# Patient Record
Sex: Female | Born: 1940
Health system: Southern US, Community
[De-identification: ages and names within clinical notes are randomized; demographics above are authoritative.]

## PROBLEM LIST (undated history)

## (undated) DIAGNOSIS — M1990 Unspecified osteoarthritis, unspecified site: Secondary | ICD-10-CM

## (undated) DIAGNOSIS — R011 Cardiac murmur, unspecified: Secondary | ICD-10-CM

## (undated) DIAGNOSIS — D649 Anemia, unspecified: Secondary | ICD-10-CM

## (undated) DIAGNOSIS — F419 Anxiety disorder, unspecified: Secondary | ICD-10-CM

## (undated) DIAGNOSIS — E785 Hyperlipidemia, unspecified: Secondary | ICD-10-CM

## (undated) DIAGNOSIS — K219 Gastro-esophageal reflux disease without esophagitis: Secondary | ICD-10-CM

## (undated) DIAGNOSIS — H269 Unspecified cataract: Secondary | ICD-10-CM

## (undated) DIAGNOSIS — I10 Essential (primary) hypertension: Secondary | ICD-10-CM

## (undated) DIAGNOSIS — Z8619 Personal history of other infectious and parasitic diseases: Secondary | ICD-10-CM

## (undated) DIAGNOSIS — M25512 Pain in left shoulder: Secondary | ICD-10-CM

## (undated) DIAGNOSIS — B029 Zoster without complications: Secondary | ICD-10-CM

## (undated) DIAGNOSIS — E559 Vitamin D deficiency, unspecified: Secondary | ICD-10-CM

## (undated) HISTORY — DX: Hyperlipidemia, unspecified: E78.5

## (undated) HISTORY — PX: CATARACT EXTRACTION: SUR2

## (undated) HISTORY — DX: Zoster without complications: B02.9

## (undated) HISTORY — DX: Unspecified osteoarthritis, unspecified site: M19.90

## (undated) HISTORY — DX: Cardiac murmur, unspecified: R01.1

## (undated) HISTORY — PX: APPENDECTOMY: SHX54

## (undated) HISTORY — DX: Unspecified cataract: H26.9

## (undated) HISTORY — DX: Gastro-esophageal reflux disease without esophagitis: K21.9

## (undated) HISTORY — DX: Anxiety disorder, unspecified: F41.9

## (undated) HISTORY — DX: Anemia, unspecified: D64.9

## (undated) HISTORY — PX: UPPER GASTROINTESTINAL ENDOSCOPY: SHX188

## (undated) HISTORY — PX: TONSILLECTOMY AND ADENOIDECTOMY: SUR1326

## (undated) HISTORY — DX: Vitamin D deficiency, unspecified: E55.9

## (undated) HISTORY — PX: COLONOSCOPY: SHX174

## (undated) HISTORY — DX: Essential (primary) hypertension: I10

## (undated) HISTORY — DX: Personal history of other infectious and parasitic diseases: Z86.19

## (undated) HISTORY — PX: FOOT SURGERY: SHX648

## (undated) HISTORY — DX: Pain in left shoulder: M25.512

---

## 1970-11-24 HISTORY — PX: ABDOMINAL HYSTERECTOMY: SHX81

## 2005-04-28 ENCOUNTER — Encounter: Admission: RE | Admit: 2005-04-28 | Discharge: 2005-04-28 | Payer: Self-pay | Admitting: Orthopedic Surgery

## 2005-04-29 ENCOUNTER — Ambulatory Visit (HOSPITAL_BASED_OUTPATIENT_CLINIC_OR_DEPARTMENT_OTHER): Admission: RE | Admit: 2005-04-29 | Discharge: 2005-04-29 | Payer: Self-pay | Admitting: Orthopedic Surgery

## 2005-04-29 ENCOUNTER — Ambulatory Visit (HOSPITAL_COMMUNITY): Admission: RE | Admit: 2005-04-29 | Discharge: 2005-04-29 | Payer: Self-pay | Admitting: Orthopedic Surgery

## 2005-12-08 ENCOUNTER — Other Ambulatory Visit: Admission: RE | Admit: 2005-12-08 | Discharge: 2005-12-08 | Payer: Self-pay | Admitting: *Deleted

## 2007-01-29 ENCOUNTER — Ambulatory Visit (HOSPITAL_COMMUNITY): Admission: RE | Admit: 2007-01-29 | Discharge: 2007-01-29 | Payer: Self-pay | Admitting: Internal Medicine

## 2007-01-29 ENCOUNTER — Ambulatory Visit: Payer: Self-pay | Admitting: Internal Medicine

## 2009-12-25 LAB — HM MAMMOGRAPHY

## 2009-12-25 LAB — HM DEXA SCAN

## 2011-07-07 ENCOUNTER — Encounter: Payer: Self-pay | Admitting: Gastroenterology

## 2011-08-28 ENCOUNTER — Other Ambulatory Visit: Payer: Self-pay | Admitting: Gastroenterology

## 2011-09-09 ENCOUNTER — Ambulatory Visit (INDEPENDENT_AMBULATORY_CARE_PROVIDER_SITE_OTHER): Payer: Medicare Other | Admitting: Family Medicine

## 2011-09-09 ENCOUNTER — Encounter: Payer: Self-pay | Admitting: Family Medicine

## 2011-09-09 DIAGNOSIS — E785 Hyperlipidemia, unspecified: Secondary | ICD-10-CM | POA: Insufficient documentation

## 2011-09-09 DIAGNOSIS — I10 Essential (primary) hypertension: Secondary | ICD-10-CM

## 2011-09-09 DIAGNOSIS — Z23 Encounter for immunization: Secondary | ICD-10-CM

## 2011-09-09 DIAGNOSIS — M810 Age-related osteoporosis without current pathological fracture: Secondary | ICD-10-CM | POA: Insufficient documentation

## 2011-09-09 DIAGNOSIS — K219 Gastro-esophageal reflux disease without esophagitis: Secondary | ICD-10-CM

## 2011-09-09 LAB — CBC WITH DIFFERENTIAL/PLATELET
Basophils Absolute: 0 10*3/uL (ref 0.0–0.1)
Eosinophils Relative: 1.6 % (ref 0.0–5.0)
MCV: 85.9 fl (ref 78.0–100.0)
Neutro Abs: 3.1 10*3/uL (ref 1.4–7.7)
Neutrophils Relative %: 58.8 % (ref 43.0–77.0)
Platelets: 239 10*3/uL (ref 150.0–400.0)
RBC: 4.42 Mil/uL (ref 3.87–5.11)
RDW: 13 % (ref 11.5–14.6)
WBC: 5.3 10*3/uL (ref 4.5–10.5)

## 2011-09-09 LAB — BASIC METABOLIC PANEL
BUN: 11 mg/dL (ref 6–23)
Calcium: 8.9 mg/dL (ref 8.4–10.5)
GFR: 116.74 mL/min (ref >60.00)
Potassium: 3.7 mEq/L (ref 3.5–5.1)
Sodium: 140 mEq/L (ref 135–145)

## 2011-09-09 LAB — LIPID PANEL
Cholesterol: 251 mg/dL — ABNORMAL HIGH (ref 0–200)
HDL: 64.4 mg/dL (ref >39.00)
Triglycerides: 126 mg/dL (ref 0.0–149.0)
VLDL: 25.2 mg/dL (ref 0.0–40.0)

## 2011-09-09 LAB — HEPATIC FUNCTION PANEL
Albumin: 4.2 g/dL (ref 3.5–5.2)
Total Protein: 7 g/dL (ref 6.0–8.3)

## 2011-09-09 NOTE — Assessment & Plan Note (Signed)
Well controlled today as pt admits to being nervous to meet new doctor.  Asymptomatic.  No med changes.

## 2011-09-09 NOTE — Assessment & Plan Note (Signed)
Pt has been taking Crestor since Feb w/ intermittent foot and lower leg cramping.  Reports she is willing to tolerate this as long as labs look good.  Check labs and adjust meds prn.  Recommended CoQ 10 to improve muscular sxs.

## 2011-09-09 NOTE — Patient Instructions (Signed)
Schedule your complete physical at your convenience- you can eat before this We'll notify you of your lab results Keep up the good work!  You look great! Call you insurance company and ask whether you can get the shingles shot here or you need to have it at the pharmacy Start the Co-Enzyme Q10 to help w/ the cramping Call with any questions or concerns Welcome!!  We're glad to have you!!!

## 2011-09-09 NOTE — Assessment & Plan Note (Signed)
Well controlled on Nexium.  No changes at this time.  Will continue to follow.

## 2011-09-09 NOTE — Progress Notes (Signed)
  Subjective:    Patient ID: Isabella Andersen, female    DOB: Nov 21, 1941, 70 y.o.   MRN: [id number]  HPI New to establish.  Previous MD- Leonia Reader [facility]), before that Dr Docia Chuck [facility])  HTN- chronic problem, on HCTZ daily.  No CP, SOB, HAs, visual changes, edema.  Cataracts- seeing Dr Franchot Heidelberg, waiting on surgery 'until they're bad enough'  Hyperlipidemia- chronic problem for pt, switched to Crestor in Feb 2012.  Will have severe cramping in toes, ankles, feet.  Was on Vytorin 10/40 previously w/ severe side effects.  Denies abdominal pain, N/V.  Willing to continue the Crestor.  GERD- chronic problem for pt, on Nexium daily.  Reports tx x15 yrs but no endoscopy.  sxs are pretty well controlled on Nexium.  Anxiety- new problem for pt, was started on SSRI by Dr Leonia Reader but never started this, not interested in taking daily medication.  Will occasionally take Alprazolam prn.   Review of Systems For ROS see HPI     Objective:   Physical Exam  Vitals reviewed. Constitutional: She is oriented to person, place, and time. She appears well-developed and well-nourished. No distress.  HENT:  Head: Normocephalic and atraumatic.  Eyes: Conjunctivae and EOM are normal. Pupils are equal, round, and reactive to light.  Neck: Normal range of motion. Neck supple. No thyromegaly present.  Cardiovascular: Normal rate, regular rhythm, normal heart sounds and intact distal pulses.   No murmur heard. Pulmonary/Chest: Effort normal and breath sounds normal. No respiratory distress.  Abdominal: Soft. She exhibits no distension. There is no tenderness.  Musculoskeletal: She exhibits no edema.  Lymphadenopathy:    She has no cervical adenopathy.  Neurological: She is alert and oriented to person, place, and time.  Skin: Skin is warm and dry.  Psychiatric: She has a normal mood and affect. Her behavior is normal.          Assessment & Plan:

## 2011-09-09 NOTE — Assessment & Plan Note (Signed)
Check Vit D level, start meds if needed.  Will arrange for DEXA at time of upcoming mammo at pt's CPE.

## 2011-09-10 LAB — LDL CHOLESTEROL, DIRECT: Direct LDL: 162.6 mg/dL

## 2011-09-12 ENCOUNTER — Telehealth: Payer: Self-pay

## 2011-09-12 LAB — VITAMIN D 1,25 DIHYDROXY
Vitamin D 1, 25 (OH)2 Total: 65 pg/mL (ref 18–72)
Vitamin D2 1, 25 (OH)2: <8 pg/mL
Vitamin D3 1, 25 (OH)2: 65 pg/mL

## 2011-09-12 NOTE — Telephone Encounter (Signed)
Message copied by Beverely Low on Fri Sep 12, 2011 11:55 AM ------      Message from: Sheliah Hatch      Created: Wed Sep 10, 2011 11:18 AM       Labs look great w/ the exception of total cholesterol and LDL.  She was switched to Crestor 5mg  in Feb- please make sure she is taking this regularly.  If she is, we will likely need to go up to 10mg .

## 2011-09-12 NOTE — Telephone Encounter (Signed)
Error

## 2011-09-15 ENCOUNTER — Telehealth: Payer: Self-pay

## 2011-09-15 NOTE — Telephone Encounter (Signed)
Message copied by Beverely Low on Mon Sep 15, 2011  5:28 PM ------      Message from: Sheliah Hatch      Created: Wed Sep 10, 2011 11:18 AM       Labs look great w/ the exception of total cholesterol and LDL.  She was switched to Crestor 5mg  in Feb- please make sure she is taking this regularly.  If she is, we will likely need to go up to 10mg .

## 2011-09-16 ENCOUNTER — Encounter: Payer: Self-pay | Admitting: *Deleted

## 2011-09-16 ENCOUNTER — Telehealth: Payer: Self-pay

## 2011-09-16 MED ORDER — ROSUVASTATIN CALCIUM 10 MG PO TABS: 10.0000 mg | ORAL_TABLET | Freq: Every day | ORAL | Status: DC

## 2011-09-16 NOTE — Telephone Encounter (Signed)
Pt aware. She has been taking the crestor 5 mg regularly.  Crestor 10 mg sent to pharmacy

## 2011-09-16 NOTE — Telephone Encounter (Signed)
Message copied by Beverely Low on Tue Sep 16, 2011  9:46 AM ------      Message from: Sheliah Hatch      Created: Wed Sep 10, 2011 11:18 AM       Labs look great w/ the exception of total cholesterol and LDL.  She was switched to Crestor 5mg  in Feb- please make sure she is taking this regularly.  If she is, we will likely need to go up to 10mg .

## 2011-10-22 ENCOUNTER — Ambulatory Visit (INDEPENDENT_AMBULATORY_CARE_PROVIDER_SITE_OTHER): Payer: Medicare Other | Admitting: Family Medicine

## 2011-10-22 ENCOUNTER — Encounter: Payer: Self-pay | Admitting: Family Medicine

## 2011-10-22 DIAGNOSIS — M810 Age-related osteoporosis without current pathological fracture: Secondary | ICD-10-CM

## 2011-10-22 DIAGNOSIS — Z0000 Encounter for general adult medical examination without abnormal findings: Secondary | ICD-10-CM

## 2011-10-22 DIAGNOSIS — Z1231 Encounter for screening mammogram for malignant neoplasm of breast: Secondary | ICD-10-CM

## 2011-10-22 DIAGNOSIS — E785 Hyperlipidemia, unspecified: Secondary | ICD-10-CM

## 2011-10-22 NOTE — Assessment & Plan Note (Signed)
Pt's Crestor increased to 10mg  at last visit.  Tolerating w/out difficulty.  Will recheck labs in 6 months.

## 2011-10-22 NOTE — Progress Notes (Signed)
  Subjective:    Patient ID: Isabella Andersen, female    DOB: Aug 18, 1941, 70 y.o.   MRN: [id number]  HPI Here today for CPE.  Risk Factors: Hyperlipidemia- labs from October showed poor control on Crestor 5mg .  Med was increased to 10mg .  Tolerating this w/out difficulty.  No abd pain, N/V, myalgias. Physical Activity: walking regularly Fall Risk: low fall risk Depression: denies current sxs Hearing: normal to whispered voice at 6 ft ADL's: independent Cognitive: normal linear thought process, memory and attention intact Home Safety: safe at home, lives w/ husband Height, Weight, BMI, Visual Acuity: see vitals, vision corrected to 20/20 w/ glasses Counseling: due for mammo and dexa (last done 2/11), due for colonoscopy- had appt to be seen but power was out (is to call and reschedule). Labs Ordered: See A&P Care Plan: See A&P    Review of Systems Patient reports no vision/hearing changes, adenopathy,fever, weight change,  persistant/recurrent hoarseness , swallowing issues, chest pain, palpitations, edema, persistant/recurrent cough, hemoptysis, dyspnea (rest/exertional/paroxysmal nocturnal), gastrointestinal bleeding (melena, rectal bleeding), abdominal pain, significant heartburn, bowel changes, GU symptoms (dysuria, hematuria, incontinence), Gyn symptoms (abnormal  bleeding, pain),  syncope, focal weakness, memory loss, numbness & tingling, skin/hair/nail changes, abnormal bruising or bleeding, anxiety, or depression.     Objective:   Physical Exam  General Appearance:    Alert, cooperative, no distress, appears stated age  Head:    Normocephalic, without obvious abnormality, atraumatic  Eyes:    PERRL, conjunctiva/corneas clear, EOM's intact, fundi    benign, both eyes  Ears:    Normal TM's and external ear canals, both ears  Nose:   Nares normal, septum midline, mucosa normal, no drainage    or sinus tenderness  Throat:   Lips, mucosa, and tongue normal; teeth and gums normal    Neck:   Supple, symmetrical, trachea midline, no adenopathy;    Thyroid: no enlargement/tenderness/nodules  Back:     Symmetric, no curvature, ROM normal, no CVA tenderness  Lungs:     Clear to auscultation bilaterally, respirations unlabored  Chest Wall:    No tenderness or deformity   Heart:    Regular rate and rhythm, S1 and S2 normal, no murmur, rub   or gallop  Breast Exam:    No tenderness, masses, or nipple abnormality  Abdomen:     Soft, non-tender, bowel sounds active all four quadrants,    no masses, no organomegaly  Genitalia:    Deferred at pt's request  Rectal:    Extremities:   Extremities normal, atraumatic, no cyanosis or edema  Pulses:   2+ and symmetric all extremities  Skin:   Skin color, texture, turgor normal, no rashes or lesions  Lymph nodes:   Cervical, supraclavicular, and axillary nodes normal  Neurologic:   CNII-XII intact, normal strength, sensation and reflexes    throughout          Assessment & Plan:

## 2011-10-22 NOTE — Assessment & Plan Note (Signed)
Due for DEXA. Order entered. 

## 2011-10-22 NOTE — Assessment & Plan Note (Addendum)
Pt's PE WNL.  mammo and dexa ordered.  Pt to call and schedule colonoscopy.  EKG done- see document for interpretation. Reviewed labs from previous visit.  Anticipatory guidance provided.

## 2011-10-22 NOTE — Patient Instructions (Signed)
Follow up in 6 months to recheck cholesterol- don't eat before this appt You look great!  Keep up the good work! Someone will call you with your mammogram and bone density appt Call and schedule your colonoscopy Call with any questions or concerns Happy Holidays!!

## 2011-11-20 ENCOUNTER — Ambulatory Visit
Admission: RE | Admit: 2011-11-20 | Discharge: 2011-11-20 | Disposition: A | Discharge status: Home / Self Care | Payer: PRIVATE HEALTH INSURANCE | Source: Ambulatory Visit | Attending: Family Medicine | Admitting: Family Medicine

## 2011-11-20 ENCOUNTER — Ambulatory Visit
Admission: RE | Admit: 2011-11-20 | Discharge: 2011-11-20 | Disposition: A | Discharge status: Home / Self Care | Payer: Medicare Other | Source: Ambulatory Visit | Attending: Family Medicine | Admitting: Family Medicine

## 2011-11-20 DIAGNOSIS — Z1231 Encounter for screening mammogram for malignant neoplasm of breast: Secondary | ICD-10-CM

## 2011-11-25 HISTORY — PX: EYE SURGERY: SHX253

## 2011-11-26 DIAGNOSIS — H2510 Age-related nuclear cataract, unspecified eye: Secondary | ICD-10-CM | POA: Diagnosis not present

## 2011-12-09 DIAGNOSIS — H2510 Age-related nuclear cataract, unspecified eye: Secondary | ICD-10-CM | POA: Diagnosis not present

## 2011-12-09 DIAGNOSIS — H2589 Other age-related cataract: Secondary | ICD-10-CM | POA: Diagnosis not present

## 2011-12-09 DIAGNOSIS — IMO0002 Reserved for concepts with insufficient information to code with codable children: Secondary | ICD-10-CM | POA: Diagnosis not present

## 2011-12-23 DIAGNOSIS — H2510 Age-related nuclear cataract, unspecified eye: Secondary | ICD-10-CM | POA: Diagnosis not present

## 2011-12-23 DIAGNOSIS — H2589 Other age-related cataract: Secondary | ICD-10-CM | POA: Diagnosis not present

## 2011-12-23 DIAGNOSIS — IMO0002 Reserved for concepts with insufficient information to code with codable children: Secondary | ICD-10-CM | POA: Diagnosis not present

## 2012-03-11 DIAGNOSIS — H35359 Cystoid macular degeneration, unspecified eye: Secondary | ICD-10-CM | POA: Diagnosis not present

## 2012-03-11 DIAGNOSIS — H35369 Drusen (degenerative) of macula, unspecified eye: Secondary | ICD-10-CM | POA: Diagnosis not present

## 2012-03-11 DIAGNOSIS — H353190 Nonexudative age-related macular degeneration, unspecified eye, stage unspecified: Secondary | ICD-10-CM | POA: Diagnosis not present

## 2012-03-23 DIAGNOSIS — H353190 Nonexudative age-related macular degeneration, unspecified eye, stage unspecified: Secondary | ICD-10-CM | POA: Diagnosis not present

## 2012-03-23 DIAGNOSIS — H35359 Cystoid macular degeneration, unspecified eye: Secondary | ICD-10-CM | POA: Diagnosis not present

## 2012-05-04 DIAGNOSIS — H35369 Drusen (degenerative) of macula, unspecified eye: Secondary | ICD-10-CM | POA: Diagnosis not present

## 2012-05-04 DIAGNOSIS — H43819 Vitreous degeneration, unspecified eye: Secondary | ICD-10-CM | POA: Diagnosis not present

## 2012-05-04 DIAGNOSIS — H353190 Nonexudative age-related macular degeneration, unspecified eye, stage unspecified: Secondary | ICD-10-CM | POA: Diagnosis not present

## 2012-07-06 DIAGNOSIS — H35359 Cystoid macular degeneration, unspecified eye: Secondary | ICD-10-CM | POA: Diagnosis not present

## 2012-07-06 DIAGNOSIS — H35369 Drusen (degenerative) of macula, unspecified eye: Secondary | ICD-10-CM | POA: Diagnosis not present

## 2012-07-13 ENCOUNTER — Telehealth: Payer: Self-pay | Admitting: Family Medicine

## 2012-07-13 NOTE — Telephone Encounter (Signed)
Caller: Isabella Andersen/Patient; Patient Name: Isabella Andersen; PCP: Sheliah Hatch.; Best Callback Phone Number: [phone]; Reason for call: Other Pt is calling for an appointment tomorrow with Dr. Beverely Low. She was transferred to the nurse for a triage. Pt is at work today with no further episodes". She tells the RN yesterday that at work yesterday - work was very stressful - she began to feel as if "everything was closing in on her" for a moment - her jaw and teeth began to ache on the right hand side which went into her neck - this lasted 10 - 20 minutes. Her husband brought her Aspirin which took the pain away and it has not come back. Pt did not have any chest pain or SOB and she has not had any further jaw pain. Pt took a Xanax yesterday and again today which she doesn't take regularly but has on hand.  Pt had run out of her BP med (HCTZ) 1 month ago and has been waiting until her next visit in October to have it filled . Pt does not measure her BP and has no idea what it is. But states that it doesn't "feel " high and she has had no h/a. Pt has felt dizzy x 2 today - both times when she has bent over to wash her hair and pick something up. It lasted a moment and resolved when she stood upright again. She other wise has no other symptoms today. She is hydrated/is drinking and eating normally. RN triaged per jaw sx and dizziness and advised an appointment within 24 hours (pt could not come today). RN scheduled pt with Dr. Beverely Low tomorrow at 11:30. RN reviewed parameters of when to call back today - if any other symptoms re-occur.

## 2012-07-14 ENCOUNTER — Encounter: Payer: Self-pay | Admitting: Family Medicine

## 2012-07-14 ENCOUNTER — Ambulatory Visit (INDEPENDENT_AMBULATORY_CARE_PROVIDER_SITE_OTHER): Payer: Medicare Other | Admitting: Family Medicine

## 2012-07-14 VITALS — BP 122/74 | HR 86 | Temp 98.2°F | Ht 64.0 in | Wt 173.0 lb

## 2012-07-14 DIAGNOSIS — F341 Dysthymic disorder: Secondary | ICD-10-CM

## 2012-07-14 DIAGNOSIS — I10 Essential (primary) hypertension: Secondary | ICD-10-CM

## 2012-07-14 DIAGNOSIS — E785 Hyperlipidemia, unspecified: Secondary | ICD-10-CM

## 2012-07-14 DIAGNOSIS — F32A Depression, unspecified: Secondary | ICD-10-CM | POA: Insufficient documentation

## 2012-07-14 DIAGNOSIS — F419 Anxiety disorder, unspecified: Secondary | ICD-10-CM | POA: Insufficient documentation

## 2012-07-14 DIAGNOSIS — F329 Major depressive disorder, single episode, unspecified: Secondary | ICD-10-CM

## 2012-07-14 LAB — BASIC METABOLIC PANEL
BUN: 9 mg/dL (ref 6–23)
Chloride: 105 mEq/L (ref 96–112)
Creatinine, Ser: 0.7 mg/dL (ref 0.4–1.2)
GFR: 86.73 mL/min (ref >60.00)
Glucose, Bld: 113 mg/dL — ABNORMAL HIGH (ref 70–99)
Sodium: 139 mEq/L (ref 135–145)

## 2012-07-14 LAB — LIPID PANEL
Cholesterol: 208 mg/dL — ABNORMAL HIGH (ref 0–200)
HDL: 66.6 mg/dL (ref >39.00)
Total CHOL/HDL Ratio: 3
VLDL: 33.8 mg/dL (ref 0.0–40.0)

## 2012-07-14 LAB — HEPATIC FUNCTION PANEL
ALT: 22 U/L (ref 0–35)
Total Bilirubin: 0.4 mg/dL (ref 0.3–1.2)

## 2012-07-14 MED ORDER — OMEPRAZOLE 40 MG PO CPDR: 40.0000 mg | DELAYED_RELEASE_CAPSULE | Freq: Every day | ORAL | Status: DC

## 2012-07-14 MED ORDER — CITALOPRAM HYDROBROMIDE 20 MG PO TABS: 20.0000 mg | ORAL_TABLET | Freq: Every day | ORAL | Status: DC

## 2012-07-14 MED ORDER — HYDROCHLOROTHIAZIDE 25 MG PO TABS: 25.0000 mg | ORAL_TABLET | Freq: Every day | ORAL | Status: DC

## 2012-07-14 MED ORDER — ALPRAZOLAM 0.25 MG PO TABS: 0.2500 mg | ORAL_TABLET | Freq: Three times a day (TID) | ORAL | Status: DC | PRN

## 2012-07-14 MED ORDER — ROSUVASTATIN CALCIUM 10 MG PO TABS: 10.0000 mg | ORAL_TABLET | Freq: Every day | ORAL | Status: DC

## 2012-07-14 NOTE — Progress Notes (Signed)
  Subjective:    Patient ID: Isabella Andersen, female    DOB: 04-23-1941, 71 y.o.   MRN: [id number]  HPI HTN- chronic problem, on HCTZ.  Well controlled today.  No CP, SOB unless anxious, edema.  Hyperlipidemia- chronic problem, on Crestor.  Did not have 6 month OV in May, due for labs.  Denies abd pain, N/V, myalgias.  Anxiety- pt reports she's 'stressed out' about work.  Monday after getting upset at work noted R sided jaw pain, neck pain, HA.  Decided next day that she will stay calm b/c she plans on quitting.  Reports episodes where she gets so upset that she can't catch her breath.  Also reports she's unable to visit her daughter b/c it's 'too stressful'.  Has been out of xanax but feels that she also needs a daily controller med.   Review of Systems For ROS see HPI     Objective:   Physical Exam  Vitals reviewed. Constitutional: She is oriented to person, place, and time. She appears well-developed and well-nourished. No distress.  HENT:  Head: Normocephalic and atraumatic.  Mouth/Throat: Oropharynx is clear and moist.       No TTP over masseter bilaterally, temporal artery bilaterally  Eyes: Conjunctivae and EOM are normal. Pupils are equal, round, and reactive to light.  Neck: Normal range of motion. Neck supple. No thyromegaly present.  Cardiovascular: Normal rate, regular rhythm, normal heart sounds and intact distal pulses.   No murmur heard. Pulmonary/Chest: Effort normal and breath sounds normal. No respiratory distress.  Abdominal: Soft. She exhibits no distension. There is no tenderness.  Musculoskeletal: She exhibits no edema.  Lymphadenopathy:    She has no cervical adenopathy.  Neurological: She is alert and oriented to person, place, and time.  Skin: Skin is warm and dry.  Psychiatric: Her behavior is normal.       Very anxious          Assessment & Plan:

## 2012-07-14 NOTE — Patient Instructions (Signed)
Schedule your physical after 11/28 Start the Celexa daily for the anxiety Use the xanax as needed Restart the blood pressure medicine We'll notify you of your lab results and make any changes if needed I agree- most of your symptoms are stress related Hang in there!!!

## 2012-07-14 NOTE — Assessment & Plan Note (Signed)
Chronic problem.  Overdue for labs.  Tolerating med w/out difficulty.  Check labs.  Adjust meds prn

## 2012-07-14 NOTE — Assessment & Plan Note (Signed)
New.  Pt's sxs now severe.  Quitting job, has stopped visiting daughter and grandkids due to anxiety and stress of difficult interactions.  Refill xanax and start daily controller SSRI.  Reviewed supportive care and red flags that should prompt return.  Pt expressed understanding and is in agreement w/ plan.

## 2012-07-14 NOTE — Assessment & Plan Note (Signed)
Chronic problem.  Adequate control.  Asymptomatic.  No changes.  Refills provided.

## 2012-08-20 DIAGNOSIS — R079 Chest pain, unspecified: Secondary | ICD-10-CM | POA: Diagnosis not present

## 2012-08-20 DIAGNOSIS — E785 Hyperlipidemia, unspecified: Secondary | ICD-10-CM | POA: Diagnosis not present

## 2012-08-20 DIAGNOSIS — R0789 Other chest pain: Secondary | ICD-10-CM | POA: Diagnosis not present

## 2012-08-20 DIAGNOSIS — F411 Generalized anxiety disorder: Secondary | ICD-10-CM | POA: Diagnosis not present

## 2012-08-20 DIAGNOSIS — I10 Essential (primary) hypertension: Secondary | ICD-10-CM | POA: Diagnosis not present

## 2012-08-20 DIAGNOSIS — K219 Gastro-esophageal reflux disease without esophagitis: Secondary | ICD-10-CM | POA: Diagnosis not present

## 2012-08-21 DIAGNOSIS — F411 Generalized anxiety disorder: Secondary | ICD-10-CM | POA: Diagnosis not present

## 2012-08-21 DIAGNOSIS — R079 Chest pain, unspecified: Secondary | ICD-10-CM | POA: Diagnosis not present

## 2012-08-21 DIAGNOSIS — E785 Hyperlipidemia, unspecified: Secondary | ICD-10-CM | POA: Diagnosis not present

## 2012-08-21 DIAGNOSIS — I10 Essential (primary) hypertension: Secondary | ICD-10-CM | POA: Diagnosis not present

## 2012-08-24 ENCOUNTER — Encounter: Payer: Self-pay | Admitting: Family Medicine

## 2012-08-24 ENCOUNTER — Ambulatory Visit (INDEPENDENT_AMBULATORY_CARE_PROVIDER_SITE_OTHER): Payer: Medicare Other | Admitting: Family Medicine

## 2012-08-24 VITALS — BP 132/84 | HR 75 | Temp 97.7°F | Ht 63.5 in | Wt 170.6 lb

## 2012-08-24 DIAGNOSIS — R079 Chest pain, unspecified: Secondary | ICD-10-CM | POA: Insufficient documentation

## 2012-08-24 NOTE — Assessment & Plan Note (Signed)
New.  Pt has had 3 episodes of similar sxs.  Ruled out for MI but given family hx of MI/sudden death will have her repeat stress test.  Suspect this is more GERD/anxiety- pt to take xanax if sxs recur and if this doesn't improve her sxs, will call 9-1-1.  Pt expressed understanding and is in agreement w/ plan.

## 2012-08-24 NOTE — Progress Notes (Signed)
  Subjective:    Patient ID: Isabella Andersen, female    DOB: [birth year], 71 y.o.   MRN: [id number]  HPI Hospital f/u- went to ER on 2023-09-17 for chest pain, neck pain, tooth pain.  Had normal EKG, normal cardiac enzymes.  Stayed overnight for r/o MI.  Was at [facility].  This is 3rd episode- first occurred on 8/19 at work, 08/17/23 after sister was found dead from MI, and 09-17-2023 at work.  Pt admits to being under 'tremendous stress'.  In the interim between episodes no CP, SOB.  Only occurs when pt is upset- 'most of the pain is here' (under ribs).  Has stopped working on Mondays b/c 'it's just too stressful'.  Has xanax available but has not taken during one of these episodes.  Feels hospital may have given her similar med which improved her sxs.  Had treadmill stress in 2008- had subsequent cath (clear).  Denies current CP, SOB, HAs, visual changes, edema, jaw pain.  + anxiety.   Review of Systems For ROS see HPI     Objective:   Physical Exam  Vitals reviewed. Constitutional: She is oriented to person, place, and time. She appears well-developed and well-nourished. No distress.  HENT:  Head: Normocephalic and atraumatic.  Eyes: Conjunctivae normal and EOM are normal. Pupils are equal, round, and reactive to light.  Neck: Normal range of motion. Neck supple. No thyromegaly present.  Cardiovascular: Normal rate, regular rhythm, normal heart sounds and intact distal pulses.   No murmur heard. Pulmonary/Chest: Effort normal and breath sounds normal. No respiratory distress.  Abdominal: Soft. She exhibits no distension. There is no tenderness.  Musculoskeletal: She exhibits no edema.  Lymphadenopathy:    She has no cervical adenopathy.  Neurological: She is alert and oriented to person, place, and time.  Skin: Skin is warm and dry.  Psychiatric: She has a normal mood and affect. Her behavior is normal.          Assessment & Plan:

## 2012-08-24 NOTE — Patient Instructions (Addendum)
We'll notify you of your Cardiology appt We'll make sure you have a clean bill of health I'm so sorry about your sister If you again have symptoms- take 1-2 alprazolam as needed.  If no improvement w/in minutes, call 9-1-1 or go to ER Hang in there!!!

## 2012-09-16 ENCOUNTER — Ambulatory Visit (INDEPENDENT_AMBULATORY_CARE_PROVIDER_SITE_OTHER): Payer: Medicare Other | Admitting: Cardiovascular Disease

## 2012-09-16 ENCOUNTER — Encounter: Payer: Self-pay | Admitting: Cardiovascular Disease

## 2012-09-16 VITALS — BP 146/71 | HR 82 | Wt 170.0 lb

## 2012-09-16 DIAGNOSIS — F329 Major depressive disorder, single episode, unspecified: Secondary | ICD-10-CM

## 2012-09-16 DIAGNOSIS — E785 Hyperlipidemia, unspecified: Secondary | ICD-10-CM | POA: Diagnosis not present

## 2012-09-16 DIAGNOSIS — R079 Chest pain, unspecified: Secondary | ICD-10-CM | POA: Diagnosis not present

## 2012-09-16 DIAGNOSIS — R011 Cardiac murmur, unspecified: Secondary | ICD-10-CM | POA: Insufficient documentation

## 2012-09-16 DIAGNOSIS — I10 Essential (primary) hypertension: Secondary | ICD-10-CM

## 2012-09-16 DIAGNOSIS — F341 Dysthymic disorder: Secondary | ICD-10-CM

## 2012-09-16 NOTE — Assessment & Plan Note (Signed)
Cholesterol is at goal.  Continue current dose of statin and diet Rx.  No myalgias or side effects.  F/U  LFT's in 6 months. No results found for this basename: LDLCALC             

## 2012-09-16 NOTE — Assessment & Plan Note (Signed)
Not previously described.  Likely AV sclerosis  F/U echo

## 2012-09-16 NOTE — Assessment & Plan Note (Signed)
Atypical normal ECG  F/U ETT  

## 2012-09-16 NOTE — Assessment & Plan Note (Signed)
Reactive secondary to aunt's death.  Not working on 2023-04-16 and Friday now  F/U [place]

## 2012-09-16 NOTE — Patient Instructions (Signed)
Your physician recommends that you schedule a follow-up appointment in:  AS NEEDED Your physician recommends that you continue on your current medications as directed. Please refer to the Current Medication list given to you today.  Your physician has requested that you have an exercise tolerance test. For further information please visit [email or web address] Please also follow instruction sheet, as given.  DX CHEST PAIN  Your physician has requested that you have an echocardiogram. Echocardiography is a painless test that uses sound waves to create images of your heart. It provides your doctor with information about the size and shape of your heart and how well your heart's chambers and valves are working. This procedure takes approximately one hour. There are no restrictions for this procedure.  DX MURMUR

## 2012-09-16 NOTE — Progress Notes (Signed)
Patient ID: Isabella Andersen, female   DOB: [birth year], 71 y.o.   MRN: [id number] Referred by Dr Beverely Low for chest pain Went to ER on 09-14-2023 for chest pain, neck pain, tooth pain. Had normal EKG, normal cardiac enzymes. Stayed overnight for r/o MI. Was at [facility]. This is 3rd episode- first occurred on 8/19 at work, 08/14/2023 after aunt was found dead from MI, and 2023-09-14 at work. Pt admits to being under 'tremendous stress'. In the interim between episodes no CP, SOB. Only occurs when pt is upset- 'most of the pain is here' (under ribs). Has stopped working on Mondays b/c 'it's just too stressful'. Has xanax available but has not taken during one of these episodes. Feels hospital may have given her similar med which improved her sxs. Had treadmill stress in 2008- had subsequent cath (clear). Denies current CP, SOB, HAs, visual changes, edema, jaw pain. + anxiety.  ROS: Denies fever, malais, weight loss, blurry vision, decreased visual acuity, cough, sputum, SOB, hemoptysis, pleuritic pain, palpitaitons, heartburn, abdominal pain, melena, lower extremity edema, claudication, or rash.  All other systems reviewed and negative  General: Affect appropriate Healthy:  appears stated age HEENT: normal Neck supple with no adenopathy JVP normal no bruits no thyromegaly Lungs clear with no wheezing and good diaphragmatic motion Heart:  S1/S2 systolic murmur, no rub, gallop or click PMI normal Abdomen: benighn, BS positve, no tenderness, no AAA no bruit.  No HSM or HJR Distal pulses intact with no bruits No edema Neuro non-focal Skin warm and dry No muscular weakness   Current Outpatient Prescriptions  Medication Sig Dispense Refill  . ALPRAZolam (XANAX) 0.25 MG tablet Take 1 tablet (0.25 mg total) by mouth 3 (three) times daily as needed for anxiety.  60 tablet  1  . citalopram (CELEXA) 20 MG tablet Take 1 tablet (20 mg total) by mouth daily.  30 tablet  3  . hydrochlorothiazide (HYDRODIURIL) 25 MG  tablet Take 1 tablet (25 mg total) by mouth daily.  90 tablet  3  . omeprazole (PRILOSEC) 40 MG capsule Take 1 capsule (40 mg total) by mouth daily.  90 capsule  3  . rosuvastatin (CRESTOR) 10 MG tablet Take 1 tablet (10 mg total) by mouth at bedtime.  90 tablet  3    Allergies  Review of patient's allergies indicates no known allergies.  Electrocardiogram:  11/12  Normal   Assessment and Plan

## 2012-09-16 NOTE — Addendum Note (Signed)
Addended by: Scherrie Bateman E on: 09/16/2012 10:22 AM   Modules accepted: Orders

## 2012-09-16 NOTE — Assessment & Plan Note (Signed)
Well controlled.  Continue current medications and low sodium Dash type diet.    

## 2012-09-21 ENCOUNTER — Ambulatory Visit (HOSPITAL_COMMUNITY): Payer: Medicare Other | Attending: Cardiology

## 2012-09-21 DIAGNOSIS — I10 Essential (primary) hypertension: Secondary | ICD-10-CM | POA: Diagnosis not present

## 2012-09-21 DIAGNOSIS — R011 Cardiac murmur, unspecified: Secondary | ICD-10-CM | POA: Diagnosis not present

## 2012-09-21 DIAGNOSIS — I059 Rheumatic mitral valve disease, unspecified: Secondary | ICD-10-CM | POA: Diagnosis not present

## 2012-09-21 DIAGNOSIS — I369 Nonrheumatic tricuspid valve disorder, unspecified: Secondary | ICD-10-CM | POA: Diagnosis not present

## 2012-09-21 DIAGNOSIS — I379 Nonrheumatic pulmonary valve disorder, unspecified: Secondary | ICD-10-CM | POA: Diagnosis not present

## 2012-09-21 NOTE — Progress Notes (Signed)
Echocardiogram performed.  

## 2012-10-05 ENCOUNTER — Ambulatory Visit (INDEPENDENT_AMBULATORY_CARE_PROVIDER_SITE_OTHER): Payer: Medicare Other | Admitting: Physician Assistant

## 2012-10-05 DIAGNOSIS — R079 Chest pain, unspecified: Secondary | ICD-10-CM

## 2012-10-05 DIAGNOSIS — R9439 Abnormal result of other cardiovascular function study: Secondary | ICD-10-CM

## 2012-10-05 NOTE — Progress Notes (Signed)
Exercise Treadmill Test  Pre-Exercise Testing Evaluation Rhythm: normal sinus  Rate: 72   PR:  .14 QRS:  .10  QT:  .42 QTc: .45           Test  Exercise Tolerance Test Ordering MD: Charlton Haws, MD  Interpreting MD: Tereso Newcomer, PA-C  Unique Test No: 1  Treadmill:  1  Indication for ETT: chest pain - rule out ischemia  Contraindication to ETT: No   Stress Modality: exercise - treadmill  Cardiac Imaging Performed: non   Protocol: standard Bruce - maximal  Max BP:  227/102  Max MPHR (bpm):  151 85% MPR (bpm):  128  MPHR obtained (bpm):  148 % MPHR obtained:  98%  Reached 85% MPHR (min:sec):  0:55 Total Exercise Time (min-sec):  3:00  Workload in METS:  4.6 Borg Scale: 13  Reason ETT Terminated:  patient's desire to stop    ST Segment Analysis At Rest: normal ST segments - no evidence of significant ST depression With Exercise: borderline ST changes  Other Information Arrhythmia:  No Angina during ETT:  absent (0) Quality of ETT:  indeterminate  ETT Interpretation:  borderline (indeterminate) with non-specific ST changes  Comments: Poor exercise tolerance. No chest pain. Normal BP response to exercise. Borderline ST depression at max stress/early recovery; cannot rule out ischemia.  Hypertensive BP response.  Patient did not take medications this AM.  Recommendations: Patient will be scheduled for [facility]. Signed, Tereso Newcomer, PA-C  11:10 AM 10/05/2012

## 2012-10-05 NOTE — Patient Instructions (Addendum)
Your physician has requested that you have a lexiscan myoview DX ABNORMAL STRESS TEST. For further information please visit [email or web address] Please follow instruction sheet, as given.

## 2012-10-14 ENCOUNTER — Ambulatory Visit (HOSPITAL_COMMUNITY): Payer: Medicare Other | Attending: Internal Medicine | Admitting: Radiology

## 2012-10-14 VITALS — BP 156/75 | HR 62 | Ht 64.0 in | Wt 166.0 lb

## 2012-10-14 DIAGNOSIS — R079 Chest pain, unspecified: Secondary | ICD-10-CM | POA: Diagnosis not present

## 2012-10-14 DIAGNOSIS — R9439 Abnormal result of other cardiovascular function study: Secondary | ICD-10-CM | POA: Diagnosis not present

## 2012-10-14 MED ORDER — REGADENOSON 0.4 MG/5ML IV SOLN
0.4000 mg | Freq: Once | INTRAVENOUS | Status: AC
  Administered 2012-10-14: 0.4 mg via INTRAVENOUS

## 2012-10-14 MED ORDER — TECHNETIUM TC 99M SESTAMIBI GENERIC - CARDIOLITE
31.0000 | Freq: Once | INTRAVENOUS | Status: AC | PRN
  Administered 2012-10-14: 31 via INTRAVENOUS

## 2012-10-14 MED ORDER — TECHNETIUM TC 99M SESTAMIBI GENERIC - CARDIOLITE
10.0000 | Freq: Once | INTRAVENOUS | Status: AC | PRN
  Administered 2012-10-14: 10 via INTRAVENOUS

## 2012-10-14 NOTE — Progress Notes (Signed)
[facility] SITE 3 NUCLEAR MED [address] [id number] [place] [address] [phone]  Cardiology Nuclear Med Study  Isabella Andersen is a 71 y.o. female     MRN : [id number]     DOB: 09-26-1941  Procedure Date: 10/14/2012  Nuclear Med Background Indication for Stress Test:  Evaluation for Ischemia, Abnormal GXT and 08/20/12 Post ED with Chest/Neck with (-) enzymes. History:  '08 Cath:Normal; 10/13 Echo:EF=60%, mild MR; 10/05/12 UXL:[id number]  Cardiac Risk Factors: Hypertension and Lipids  Symptoms:  Chest/Neck Pain (last episode of chest discomfort at time of last treadmill, 10/05/12) and DOE   Nuclear Pre-Procedure Caffeine/Decaff Intake:  9:00pm NPO After: 8:00pm   Lungs:  Clear. O2 Sat: 94% on room air. IV 0.9% NS with Angio Cath:  22g  IV Site: R Hand  IV Started by:  Cathlyn Parsons, RN  Chest Size (in):  40 Cup Size: B  Height: 5\' 4"  (1.626 m)  Weight:  166 lb (75.297 kg)  BMI:  Body mass index is 28.49 kg/(m^2). Tech Comments:  n/a    Nuclear Med Study 1 or 2 day study: 1 day  Stress Test Type:  Treadmill/Lexiscan  Reading MD: Dietrich Pates, MD  Order Authorizing Provider:  Charlton Haws, MD and Tereso Newcomer, PA-C  Resting Radionuclide: Technetium 41m Sestamibi  Resting Radionuclide Dose: 10.0 mCi   Stress Radionuclide:  Technetium 43m Sestamibi  Stress Radionuclide Dose: 31.8 mCi           Stress Protocol Rest HR: 62 Stress HR: 134  Rest BP: 156/75 Stress BP: 213/82  Exercise Time (min): 2:00 METS: n/a   Predicted Max HR: 151 bpm % Max HR: 88.74 bpm Rate Pressure Product: 36644   Dose of Adenosine (mg):  n/a Dose of Lexiscan: n/a mg  Dose of Atropine (mg): n/a Dose of Dobutamine: n/a mcg/kg/min (at max HR)  Stress Test Technologist: Smiley Houseman, CMA-N  Nuclear Technologist:  Domenic Polite, CNMT     Rest Procedure:  Myocardial perfusion imaging was performed at rest 45 minutes following the intravenous administration of  Technetium 20m Sestamibi.  Rest ECG: NSR 62 bpm.  Stress Procedure:  The patient received IV Lexiscan 0.4 mg over 15-seconds with concurrent low level exercise and then Technetium 29m Sestamibi was injected at 30-seconds while the patient continued walking one more minute. There were nonspecific ST-T wave changes with Lexiscan.  She c/o her chest "throbbing">back with Lexiscan.  Quantitative spect images were obtained after a 45-minute delay.  Stress ECG: 1 mm ST depression in leads III, AVF in immediate recovery, becoming insignificant by 30 sec  QPS Raw Data Images:  SOft tissue (diaphragm, bowel activity) underlie heart. Stress Images:  Normal homogeneous uptake in all areas of the myocardium. Rest Images:  Normal homogeneous uptake in all areas of the myocardium. Subtraction (SDS):  No evidence of ischemia. Transient Ischemic Dilatation (Normal <1.22):  1.06 Lung/Heart Ratio (Normal <0.45):  0.23  Quantitative Gated Spect Images QGS EDV:  82 ml QGS ESV:  26 ml  Impression Exercise Capacity:  Lexiscan with low level exercise. BP Response:  Normal blood pressure response. Clinical Symptoms:  Mild chest pain/dyspnea. ECG Impression:  Electrically positive though specificity limited. Comparison with Prior Nuclear Study: No previous nuclear study performed  Overall Impression:  Normal stress nuclear study.  LV Ejection Fraction: 68%.  LV Wall Motion:  NL LV Function; NL Wall Motion  Dietrich Pates

## 2012-10-15 ENCOUNTER — Telehealth: Payer: Self-pay | Admitting: *Deleted

## 2012-10-15 NOTE — Telephone Encounter (Signed)
Message copied by Tarri Fuller on Fri Oct 15, 2012  3:24 PM ------      Message from: [place], [place] T      Created: Fri Oct 15, 2012  1:38 PM       As noted by Dr. Charlton Haws.      Please inform patient.      Tereso Newcomer, PA-C  1:38 PM 10/15/2012

## 2012-10-15 NOTE — Telephone Encounter (Signed)
lmom stress test normal per Dr. Eden Emms

## 2012-10-26 ENCOUNTER — Ambulatory Visit (INDEPENDENT_AMBULATORY_CARE_PROVIDER_SITE_OTHER): Payer: Medicare Other | Admitting: Family Medicine

## 2012-10-26 ENCOUNTER — Encounter: Payer: Self-pay | Admitting: Family Medicine

## 2012-10-26 VITALS — BP 110/70 | HR 73 | Temp 97.9°F | Ht 64.0 in | Wt 167.0 lb

## 2012-10-26 DIAGNOSIS — Z1211 Encounter for screening for malignant neoplasm of colon: Secondary | ICD-10-CM

## 2012-10-26 DIAGNOSIS — I10 Essential (primary) hypertension: Secondary | ICD-10-CM | POA: Diagnosis not present

## 2012-10-26 DIAGNOSIS — M810 Age-related osteoporosis without current pathological fracture: Secondary | ICD-10-CM

## 2012-10-26 DIAGNOSIS — E785 Hyperlipidemia, unspecified: Secondary | ICD-10-CM

## 2012-10-26 DIAGNOSIS — Z23 Encounter for immunization: Secondary | ICD-10-CM | POA: Diagnosis not present

## 2012-10-26 DIAGNOSIS — Z0000 Encounter for general adult medical examination without abnormal findings: Secondary | ICD-10-CM | POA: Diagnosis not present

## 2012-10-26 LAB — HEPATIC FUNCTION PANEL
AST: 21 U/L (ref 0–37)
Albumin: 4.2 g/dL (ref 3.5–5.2)
Alkaline Phosphatase: 85 U/L (ref 39–117)

## 2012-10-26 LAB — LIPID PANEL
Cholesterol: 198 mg/dL (ref 0–200)
Total CHOL/HDL Ratio: 3
Triglycerides: 131 mg/dL (ref 0.0–149.0)

## 2012-10-26 LAB — CBC WITH DIFFERENTIAL/PLATELET
Basophils Absolute: 0 10*3/uL (ref 0.0–0.1)
Eosinophils Absolute: 0.1 10*3/uL (ref 0.0–0.7)
Lymphocytes Relative: 29.4 % (ref 12.0–46.0)
Lymphs Abs: 1.2 10*3/uL (ref 0.7–4.0)
MCHC: 32.4 g/dL (ref 30.0–36.0)
Monocytes Absolute: 0.4 10*3/uL (ref 0.1–1.0)
Monocytes Relative: 9.6 % (ref 3.0–12.0)
Neutro Abs: 2.4 10*3/uL (ref 1.4–7.7)
WBC: 4.1 10*3/uL — ABNORMAL LOW (ref 4.5–10.5)

## 2012-10-26 LAB — BASIC METABOLIC PANEL
BUN: 16 mg/dL (ref 6–23)
Chloride: 102 mEq/L (ref 96–112)
Glucose, Bld: 98 mg/dL (ref 70–99)
Potassium: 3.5 mEq/L (ref 3.5–5.1)

## 2012-10-26 NOTE — Patient Instructions (Addendum)
Follow up in 6 months to recheck BP and cholesterol Keep up the good work!  You look great! Call and schedule your mammo and bone density We'll call you with your colonoscopy appt Call with any questions or concerns Happy Holidays!!

## 2012-10-26 NOTE — Assessment & Plan Note (Signed)
Chronic problem, excellent control.  Asymptomatic.  No changes. 

## 2012-10-26 NOTE — Assessment & Plan Note (Signed)
Pt's PE WNL.  Pt to schedule mammo and DEXA.  Referral for colonoscopy made.  Check labs.  Anticipatory guidance provided.

## 2012-10-26 NOTE — Assessment & Plan Note (Signed)
Chronic problem, tolerating statin w/out difficulty.  Check labs.  Adjust meds prn  

## 2012-10-26 NOTE — Progress Notes (Signed)
  Subjective:    Patient ID: Isabella Andersen, female    DOB: 1941-02-07, 71 y.o.   MRN: [id number]  HPI Here today for CPE.  Risk Factors: HTN- chronic problem, excellent control today on HCTZ.  No recent CP, SOB, HAs, visual changes, edema. Hyperlipidemia- chronic problem, on Crestor.  Denies abd pain, N/V. Myalgias. Physical Activity: walking regularly, up to 2 hrs daily Fall Risk: low risk Depression: ongoing problem, on Celexa and xanax Hearing: normal to conversational tones and whispered voice at 6 ft ADL's: independent Cognitive: normal linear thought process, memory and attention intact Home Safety: feels safe at home, lives w/ husband Height, Weight, BMI, Visual Acuity: see vitals, vision corrected to 20/20 w/ glasses/cataract surgery Counseling: UTD on mammo and DEXA.  Overdue on colonoscopy- prefers to wait until new year.  No longer having paps due to TAH Labs Ordered: See A&P Care Plan: See A&P    Review of Systems Patient reports no vision/ hearing changes, adenopathy,fever, weight change,  persistant/recurrent hoarseness , swallowing issues, chest pain, palpitations, edema, persistant/recurrent cough, hemoptysis, dyspnea (rest/exertional/paroxysmal nocturnal), gastrointestinal bleeding (melena, rectal bleeding), abdominal pain, significant heartburn, bowel changes, GU symptoms (dysuria, hematuria, incontinence), Gyn symptoms (abnormal  bleeding, pain),  syncope, focal weakness, memory loss, numbness & tingling, skin/hair/nail changes, abnormal bruising or bleeding, anxiety, or depression.     Objective:   Physical Exam General Appearance:    Alert, cooperative, no distress, appears stated age  Head:    Normocephalic, without obvious abnormality, atraumatic  Eyes:    PERRL, conjunctiva/corneas clear, EOM's intact, fundi    benign, both eyes  Ears:    Normal TM's and external ear canals, both ears  Nose:   Nares normal, septum midline, mucosa normal, no drainage    or  sinus tenderness  Throat:   Lips, mucosa, and tongue normal; teeth and gums normal  Neck:   Supple, symmetrical, trachea midline, no adenopathy;    Thyroid: no enlargement/tenderness/nodules  Back:     Symmetric, no curvature, ROM normal, no CVA tenderness  Lungs:     Clear to auscultation bilaterally, respirations unlabored  Chest Wall:    No tenderness or deformity   Heart:    Regular rate and rhythm, S1 and S2 normal, no murmur, rub   or gallop  Breast Exam:    Deferred at pt's request  Abdomen:     Soft, non-tender, bowel sounds active all four quadrants,    no masses, no organomegaly  Genitalia:    Deferred at pt's request  Rectal:    Extremities:   Extremities normal, atraumatic, no cyanosis or edema  Pulses:   2+ and symmetric all extremities  Skin:   Skin color, texture, turgor normal, no rashes or lesions  Lymph nodes:   Cervical, supraclavicular, and axillary nodes normal  Neurologic:   CNII-XII intact, normal strength, sensation and reflexes    throughout          Assessment & Plan:

## 2012-10-26 NOTE — Assessment & Plan Note (Signed)
Pt due for DEXA.  She is to call and schedule at her convenience.  Check Vit D level.

## 2012-10-28 ENCOUNTER — Encounter: Payer: Self-pay | Admitting: *Deleted

## 2012-10-28 DIAGNOSIS — E785 Hyperlipidemia, unspecified: Secondary | ICD-10-CM | POA: Diagnosis not present

## 2012-10-28 DIAGNOSIS — I10 Essential (primary) hypertension: Secondary | ICD-10-CM | POA: Diagnosis not present

## 2012-10-29 LAB — VITAMIN D 1,25 DIHYDROXY: Vitamin D 1, 25 (OH)2 Total: 52 pg/mL (ref 18–72)

## 2012-11-12 ENCOUNTER — Encounter: Payer: Self-pay | Admitting: Gastroenterology

## 2012-12-09 ENCOUNTER — Ambulatory Visit (AMBULATORY_SURGERY_CENTER): Payer: Medicare Other | Admitting: *Deleted

## 2012-12-09 VITALS — Ht 63.5 in | Wt 171.4 lb

## 2012-12-09 DIAGNOSIS — Z800 Family history of malignant neoplasm of digestive organs: Secondary | ICD-10-CM

## 2012-12-09 DIAGNOSIS — Z1211 Encounter for screening for malignant neoplasm of colon: Secondary | ICD-10-CM

## 2012-12-09 DIAGNOSIS — Z8371 Family history of colonic polyps: Secondary | ICD-10-CM

## 2012-12-09 MED ORDER — PEG-KCL-NACL-NASULF-NA ASC-C 100 G PO SOLR: ORAL | Status: DC

## 2012-12-09 NOTE — Progress Notes (Signed)
No allergies to eggs or soy products 

## 2012-12-23 ENCOUNTER — Encounter: Payer: Self-pay | Admitting: Gastroenterology

## 2012-12-23 ENCOUNTER — Ambulatory Visit (AMBULATORY_SURGERY_CENTER): Payer: Medicare Other | Admitting: Gastroenterology

## 2012-12-23 VITALS — BP 178/89 | HR 73 | Temp 97.8°F | Resp 21 | Ht 63.5 in | Wt 171.0 lb

## 2012-12-23 DIAGNOSIS — Z1211 Encounter for screening for malignant neoplasm of colon: Secondary | ICD-10-CM

## 2012-12-23 DIAGNOSIS — D126 Benign neoplasm of colon, unspecified: Secondary | ICD-10-CM

## 2012-12-23 DIAGNOSIS — I10 Essential (primary) hypertension: Secondary | ICD-10-CM | POA: Diagnosis not present

## 2012-12-23 DIAGNOSIS — Z800 Family history of malignant neoplasm of digestive organs: Secondary | ICD-10-CM

## 2012-12-23 MED ORDER — SODIUM CHLORIDE 0.9 % IV SOLN: 500.0000 mL | INTRAVENOUS | Status: DC

## 2012-12-23 NOTE — Progress Notes (Signed)
Called to room to assist during endoscopic procedure.  Patient ID and intended procedure confirmed with present staff. Received instructions for my participation in the procedure from the performing physician.  

## 2012-12-23 NOTE — Op Note (Signed)
[facility] [address] [place], [address]   COLONOSCOPY PROCEDURE REPORT PATIENT: Isabella Andersen, Isabella Andersen  MR#: [id number] BIRTHDATE: 1941-10-17 , 71  yrs. old GENDER: Female ENDOSCOPIST: Meryl Dare, MD, [facility] PROCEDURE DATE:  12/23/2012 PROCEDURE:   Colonoscopy with snare polypectomy and biopsy ASA CLASS:   Class II INDICATIONS:elevated risk screening: patient's immediate family history of colon cancer, and patient's family history of colon polyps. MEDICATIONS: MAC sedation, administered by CRNA and propofol (Diprivan) 200mg  IV DESCRIPTION OF PROCEDURE:   After the risks benefits and alternatives of the procedure were thoroughly explained, informed consent was obtained.  A digital rectal exam revealed no abnormalities of the rectum.   The LB [id number]  endoscope was introduced through the anus and advanced to the cecum, which was identified by both the appendix and ileocecal valve. No adverse events experienced.   The quality of the prep was excellent, using MoviPrep  The instrument was then slowly withdrawn as the colon was fully examined.  COLON FINDINGS: A sessile polyp measuring 6 mm in size was found in the ascending colon.  A polypectomy was performed with a cold snare.  The resection was complete and the polyp tissue was completely retrieved.   A sessile polyp measuring 4 mm in size was found in the transverse colon.  A polypectomy was performed with cold forceps.  The resection was complete and the polyp tissue was completely retrieved.   A semi-pedunculated polyp measuring 8 mm in size was found in the sigmoid colon.  A polypectomy was performed using snare cautery.  The resection was complete and the polyp tissue was completely retrieved.   The colon was otherwise normal. There was no diverticulosis, inflammation, polyps or cancers unless previously stated.  Retroflexed views revealed no abnormalities. The time to cecum=3 minutes 11 seconds.   Withdrawal time=11 minutes 12 seconds.  The scope was withdrawn and the procedure completed.  COMPLICATIONS: There were no complications.  ENDOSCOPIC IMPRESSION: 1.   Sessile polyp measuring 6 mm in the ascending colon; polypectomy performed with a cold snare 2.   Sessile polyp measuring 4 mm in the transverse colon; polypectomy performed with cold forceps 3.   Semi-pedunculated polyp measuring 8 mm in the sigmoid colon; polypectomy performed using snare cautery  RECOMMENDATIONS: 1.  Hold aspirin, aspirin products, and anti-inflammatory medication for 2 weeks. 2.  Await pathology results 3.  Repeat colonoscopy in 3 years if all 3 polyps are adenomatous; otherwise 5 years  eSigned:  Meryl Dare, MD, [facility] 12/23/2012 10:10 AM

## 2012-12-23 NOTE — Patient Instructions (Addendum)
YOU HAD AN ENDOSCOPIC PROCEDURE TODAY AT THE [facility]: Refer to the procedure report that was given to you for any specific questions about what was found during the examination.  If the procedure report does not answer your questions, please call your gastroenterologist to clarify.  If you requested that your care partner not be given the details of your procedure findings, then the procedure report has been included in a sealed envelope for you to review at your convenience later.  YOU SHOULD EXPECT: Some feelings of bloating in the abdomen. Passage of more gas than usual.  Walking can help get rid of the air that was put into your GI tract during the procedure and reduce the bloating. If you had a lower endoscopy (such as a colonoscopy or flexible sigmoidoscopy) you may notice spotting of blood in your stool or on the toilet paper. If you underwent a bowel prep for your procedure, then you may not have a normal bowel movement for a few days.  DIET: Your first meal following the procedure should be a light meal and then it is ok to progress to your normal diet.  A half-sandwich or bowl of soup is an example of a good first meal.  Heavy or fried foods are harder to digest and may make you feel nauseous or bloated.  Likewise meals heavy in dairy and vegetables can cause extra gas to form and this can also increase the bloating.  Drink plenty of fluids but you should avoid alcoholic beverages for 24 hours.  ACTIVITY: Your care partner should take you home directly after the procedure.  You should plan to take it easy, moving slowly for the rest of the day.  You can resume normal activity the day after the procedure however you should NOT DRIVE or use heavy machinery for 24 hours (because of the sedation medicines used during the test).    SYMPTOMS TO REPORT IMMEDIATELY: A gastroenterologist can be reached at any hour.  During normal business hours, 8:30 AM to 5:00 PM Monday through Friday,  call [phone].  After hours and on weekends, please call the GI answering service at [phone] who will take a message and have the physician on call contact you.   Following lower endoscopy (colonoscopy or flexible sigmoidoscopy):  Excessive amounts of blood in the stool  Significant tenderness or worsening of abdominal pains  Swelling of the abdomen that is new, acute  Fever of 100F or higher    FOLLOW UP: If any biopsies were taken you will be contacted by phone or by letter within the next 1-3 weeks.  Call your gastroenterologist if you have not heard about the biopsies in 3 weeks.  Our staff will call the home number listed on your records the next business day following your procedure to check on you and address any questions or concerns that you may have at that time regarding the information given to you following your procedure. This is a courtesy call and so if there is no answer at the home number and we have not heard from you through the emergency physician on call, we will assume that you have returned to your regular daily activities without incident.  SIGNATURES/CONFIDENTIALITY: You and/or your care partner have signed paperwork which will be entered into your electronic medical record.  These signatures attest to the fact that that the information above on your After Visit Summary has been reviewed and is understood.  Full responsibility of the confidentiality   of this discharge information lies with you and/or your care-partner.     Hold aspirin and anti inflammatory products for 2 weeks  Await pathology results

## 2012-12-23 NOTE — Progress Notes (Signed)
Patient did not experience any of the following events: a burn prior to discharge; a fall within the facility; wrong site/side/patient/procedure/implant event; or a hospital transfer or hospital admission upon discharge from the facility. (G8907) Patient did not have preoperative order for IV antibiotic SSI prophylaxis. (G8918)  

## 2012-12-24 ENCOUNTER — Telehealth: Payer: Self-pay

## 2012-12-24 NOTE — Telephone Encounter (Signed)
Left a message on the pt's am at [phone] to please call if she has any questions or concerns. Maw

## 2012-12-29 ENCOUNTER — Encounter: Payer: Self-pay | Admitting: Gastroenterology

## 2013-02-14 ENCOUNTER — Other Ambulatory Visit: Payer: Self-pay

## 2013-02-14 DIAGNOSIS — Z1231 Encounter for screening mammogram for malignant neoplasm of breast: Secondary | ICD-10-CM

## 2013-03-01 ENCOUNTER — Ambulatory Visit
Admission: RE | Admit: 2013-03-01 | Discharge: 2013-03-01 | Disposition: A | Discharge status: Home / Self Care | Payer: Medicare Other | Source: Ambulatory Visit

## 2013-03-01 DIAGNOSIS — Z1231 Encounter for screening mammogram for malignant neoplasm of breast: Secondary | ICD-10-CM

## 2013-08-17 DIAGNOSIS — H04129 Dry eye syndrome of unspecified lacrimal gland: Secondary | ICD-10-CM | POA: Diagnosis not present

## 2013-08-17 DIAGNOSIS — H02839 Dermatochalasis of unspecified eye, unspecified eyelid: Secondary | ICD-10-CM | POA: Diagnosis not present

## 2013-08-17 DIAGNOSIS — H26499 Other secondary cataract, unspecified eye: Secondary | ICD-10-CM | POA: Diagnosis not present

## 2013-08-23 ENCOUNTER — Ambulatory Visit: Payer: Medicare Other | Admitting: Family Medicine

## 2013-08-29 ENCOUNTER — Encounter: Payer: Self-pay | Admitting: Family Medicine

## 2013-08-29 ENCOUNTER — Ambulatory Visit (INDEPENDENT_AMBULATORY_CARE_PROVIDER_SITE_OTHER): Payer: Medicare Other | Admitting: Family Medicine

## 2013-08-29 VITALS — BP 132/80 | HR 82 | Temp 98.4°F | Resp 16 | Wt 171.1 lb

## 2013-08-29 DIAGNOSIS — Z23 Encounter for immunization: Secondary | ICD-10-CM

## 2013-08-29 DIAGNOSIS — Z01818 Encounter for other preprocedural examination: Secondary | ICD-10-CM

## 2013-08-29 MED ORDER — ALPRAZOLAM 0.25 MG PO TABS: 0.2500 mg | ORAL_TABLET | Freq: Three times a day (TID) | ORAL | Status: DC | PRN

## 2013-08-29 NOTE — Patient Instructions (Signed)
We'll notify you of your lab results and make any changes if needed Keep up the good work!  You look great! Call with any questions or concerns Good luck!!!

## 2013-08-29 NOTE — Progress Notes (Signed)
   Subjective:    Isabella Andersen is a 71 y.o. female who presents to the office today for a preoperative consultation at the request of surgeon Contogiannis who plans on performing eyelid lift on TBD in October 2014  . This consultation is requested for the specific conditions prompting preoperative evaluation (i.e. because of potential affect on operative risk): none. Planned anesthesia: general. The patient has the following known anesthesia issues: no hx of problems. Patients bleeding risk: no recent abnormal bleeding and no remote history of abnormal bleeding. Patient does not have objections to receiving blood products if needed.  The following portions of the patient's history were reviewed and updated as appropriate: allergies, current medications, past family history, past medical history, past social history, past surgical history and problem list.  Review of Systems A comprehensive review of systems was negative.    Objective:    BP 132/80  Pulse 82  Temp(Src) 98.4 F (36.9 C) (Oral)  Resp 16  Wt 171 lb 2 oz (77.622 kg)  BMI 29.83 kg/m2  SpO2 97% General appearance: alert, cooperative, appears stated age and no distress Head: Normocephalic, without obvious abnormality, atraumatic Eyes: conjunctivae/corneas clear. PERRL, EOM's intact. Fundi benign. Throat: lips, mucosa, and tongue normal; teeth and gums normal Neck: no adenopathy, no JVD, supple, symmetrical, trachea midline and thyroid not enlarged, symmetric, no tenderness/mass/nodules Lungs: clear to auscultation bilaterally Heart: regular rate and rhythm, S1, S2 normal, no murmur, click, rub or gallop Abdomen: soft, non-tender; bowel sounds normal; no masses,  no organomegaly Extremities: extremities normal, atraumatic, no cyanosis or edema Pulses: 2+ and symmetric Skin: Skin color, texture, turgor normal. No rashes or lesions  Predictors of intubation difficulty:  Morbid obesity? no  Anatomically abnormal facies? no  Prominent incisors? no  Receding mandible? no  Short, thick neck? no  Neck range of motion: normal  Dentition: No chipped, loose, or missing teeth.  Cardiographics ECG: no change since previous ECG dated 09/2012 Echocardiogram: not done Nuclear stress test: November 2013- NORMAL  Imaging Chest x-ray: Not Done   Lab Review  not applicable To be collected      Assessment:      72 y.o. female with planned surgery as above.   Known risk factors for perioperative complications: None   Difficulty with intubation is not anticipated.  Cardiac Risk Estimation: LOW  Current medications which may produce withdrawal symptoms if withheld perioperatively: none      Plan:    1. Preoperative workup as follows hemoglobin, electrolytes, creatinine, liver function studies. 2. Change in medication regimen before surgery: none, continue medication regimen including morning of surgery, with sip of water. 3. Prophylaxis for cardiac events with perioperative beta-blockers: not indicated. 4. Invasive hemodynamic monitoring perioperatively: at the discretion of anesthesiologist. 5. Deep vein thrombosis prophylaxis postoperatively:regimen to be chosen by surgical team. 6. Surveillance for postoperative MI with ECG immediately postoperatively and on postoperative days 1 and 2 AND troponin levels 24 hours postoperatively and on day 4 or hospital discharge (whichever comes first): at the discretion of anesthesiologist. 7. Other measures: NA

## 2013-08-30 LAB — BASIC METABOLIC PANEL
CO2: 29 mEq/L (ref 19–32)
Calcium: 8.9 mg/dL (ref 8.4–10.5)
Creatinine, Ser: 0.6 mg/dL (ref 0.4–1.2)
GFR: 111.38 mL/min (ref >60.00)
Glucose, Bld: 89 mg/dL (ref 70–99)

## 2013-08-30 LAB — CBC WITH DIFFERENTIAL/PLATELET
Eosinophils Relative: 3 % (ref 0.0–5.0)
HCT: 36.2 % (ref 36.0–46.0)
Hemoglobin: 12.3 g/dL (ref 12.0–15.0)
Lymphs Abs: 1.6 10*3/uL (ref 0.7–4.0)
MCV: 82.5 fl (ref 78.0–100.0)
Monocytes Absolute: 0.3 10*3/uL (ref 0.1–1.0)
Monocytes Relative: 6.9 % (ref 3.0–12.0)
Neutro Abs: 2.8 10*3/uL (ref 1.4–7.7)
RDW: 13.6 % (ref 11.5–14.6)

## 2013-08-30 LAB — HEPATIC FUNCTION PANEL
Albumin: 4.1 g/dL (ref 3.5–5.2)
Total Protein: 6.9 g/dL (ref 6.0–8.3)

## 2013-08-31 ENCOUNTER — Encounter: Payer: Self-pay | Admitting: General Practice

## 2013-08-31 ENCOUNTER — Other Ambulatory Visit: Payer: Self-pay | Admitting: General Practice

## 2013-08-31 MED ORDER — POTASSIUM CHLORIDE CRYS ER 20 MEQ PO TBCR: 20.0000 meq | EXTENDED_RELEASE_TABLET | Freq: Every day | ORAL | Status: DC

## 2013-09-21 ENCOUNTER — Ambulatory Visit (INDEPENDENT_AMBULATORY_CARE_PROVIDER_SITE_OTHER): Payer: Medicare Other | Admitting: Family Medicine

## 2013-09-21 ENCOUNTER — Encounter: Payer: Self-pay | Admitting: Family Medicine

## 2013-09-21 VITALS — BP 120/80 | HR 89 | Temp 98.1°F | Resp 16 | Wt 169.2 lb

## 2013-09-21 DIAGNOSIS — R05 Cough: Secondary | ICD-10-CM | POA: Diagnosis not present

## 2013-09-21 MED ORDER — PROMETHAZINE-DM 6.25-15 MG/5ML PO SYRP: 5.0000 mL | ORAL_SOLUTION | Freq: Four times a day (QID) | ORAL | Status: DC | PRN

## 2013-09-21 MED ORDER — BENZONATATE 200 MG PO CAPS: 200.0000 mg | ORAL_CAPSULE | Freq: Three times a day (TID) | ORAL | Status: DC | PRN

## 2013-09-21 MED ORDER — PREDNISONE 10 MG PO TABS: ORAL_TABLET | ORAL | Status: DC

## 2013-09-21 MED ORDER — ALBUTEROL SULFATE (2.5 MG/3ML) 0.083% IN NEBU
2.5000 mg | INHALATION_SOLUTION | Freq: Once | RESPIRATORY_TRACT | Status: AC
  Administered 2013-09-21: 2.5 mg via RESPIRATORY_TRACT

## 2013-09-21 NOTE — Assessment & Plan Note (Signed)
Suspect viral bronchitis w/ airway inflammation.  No need for abx.  Pt has extensive list of meds that are not approved prior to surgery- including prednisone.  This would improve pt's cough and airway inflammation so pt to call surgeon to clear this prior to starting.  Cough meds prn.  Reviewed supportive care and red flags that should prompt return.  Pt expressed understanding and is in agreement w/ plan.

## 2013-09-21 NOTE — Progress Notes (Signed)
  Subjective:    Patient ID: Isabella Andersen, female    DOB: 02/19/1941, 72 y.o.   MRN: [id number]  HPI Cough- pt developed URI on Friday.  Called surgical coordinator and was cleared to use Afrin.  Over the weekend pt felt sxs 'settled into her chest'.  Has been using husband's albuterol inhaler w/ some relief.  Having R sided tooth pain.  Denies current facial pain/pressure.  + nasal congestion.  Was supposed to have surgery tomorrow but this was postponed until next week.   Review of Systems For ROS see HPI     Objective:   Physical Exam  Vitals reviewed. Constitutional: She appears well-developed and well-nourished. No distress.  HENT:  Head: Normocephalic and atraumatic.  TMs normal bilaterally Mild nasal congestion Throat w/out erythema, edema, or exudate No TTP over sinuses  Eyes: Conjunctivae and EOM are normal. Pupils are equal, round, and reactive to light.  Neck: Normal range of motion. Neck supple.  Cardiovascular: Normal rate, regular rhythm, normal heart sounds and intact distal pulses.   No murmur heard. Pulmonary/Chest: Effort normal and breath sounds normal. No respiratory distress. She has no wheezes.  + hacking cough Poor air movement and chest tightness  Lymphadenopathy:    She has no cervical adenopathy.          Assessment & Plan:

## 2013-09-21 NOTE — Patient Instructions (Signed)
Follow up as needed Use the cough syrup at night- will cause drowsiness Use the cough pills for daytime Drink plenty of fluids REST! Call and ask your surgeon if you are able to take the Prednisone- you will be fine without it, but you'll be more comfortable with it Call with any questions or concerns Hang in there!!!

## 2013-09-23 ENCOUNTER — Telehealth: Payer: Self-pay | Admitting: Family Medicine

## 2013-09-23 MED ORDER — AZITHROMYCIN 250 MG PO TABS: ORAL_TABLET | ORAL | Status: DC

## 2013-09-23 NOTE — Telephone Encounter (Signed)
Ok for Zpack 

## 2013-09-23 NOTE — Telephone Encounter (Signed)
Patient called and stated that her surgeon said that she needs an antibiotic to clear up her congestion before he can put her to sleep. Also patient stated that she called her surgeon office about taking the prednisone but they never got back with her. Patient wanted to see if dr Beverely Low could call something in for her. Thanks    Pharmacy  [id number] - [place], [place] - [address] AT [address] OF [address]

## 2013-09-23 NOTE — Telephone Encounter (Signed)
Med filled.  

## 2013-10-05 ENCOUNTER — Encounter: Payer: Self-pay | Admitting: Lab

## 2013-10-06 ENCOUNTER — Encounter: Payer: Self-pay | Admitting: Family Medicine

## 2013-10-06 ENCOUNTER — Ambulatory Visit (INDEPENDENT_AMBULATORY_CARE_PROVIDER_SITE_OTHER): Payer: Medicare Other | Admitting: Family Medicine

## 2013-10-06 VITALS — BP 130/76 | HR 87 | Temp 97.7°F | Resp 16 | Wt 169.2 lb

## 2013-10-06 DIAGNOSIS — I10 Essential (primary) hypertension: Secondary | ICD-10-CM | POA: Diagnosis not present

## 2013-10-06 NOTE — Patient Instructions (Signed)
Follow up as needed Good Luck!!!

## 2013-10-06 NOTE — Progress Notes (Signed)
  Subjective:    Patient ID: Isabella Andersen, female    DOB: 03/03/1941, 72 y.o.   MRN: [id number]  HPI Pre visit review using our clinic review tool, if applicable. No additional management support is needed unless otherwise documented below in the visit note.  Surgical clearance- pt was told to have BP check and 'lungs checked' before surgery.  Denies SOB, CP, HAs, visual changes, edema.  No longer coughing.  Never filled Prednisone.  Was previously cleared for procedure but then developed URI.   Review of Systems For ROS see HPI     Objective:   Physical Exam  Vitals reviewed. Constitutional: She is oriented to person, place, and time. She appears well-developed and well-nourished. No distress.  HENT:  Head: Normocephalic and atraumatic.  Eyes: Conjunctivae and EOM are normal. Pupils are equal, round, and reactive to light.  Neck: Normal range of motion. Neck supple. No thyromegaly present.  Cardiovascular: Normal rate, regular rhythm, normal heart sounds and intact distal pulses.   Pulmonary/Chest: Effort normal and breath sounds normal. No respiratory distress.  Abdominal: Soft. She exhibits no distension. There is no tenderness.  Musculoskeletal: She exhibits no edema.  Lymphadenopathy:    She has no cervical adenopathy.  Neurological: She is alert and oriented to person, place, and time.  Skin: Skin is warm and dry.  Psychiatric: She has a normal mood and affect. Her behavior is normal.          Assessment & Plan:

## 2013-10-06 NOTE — Assessment & Plan Note (Signed)
BP improved was pt was reassured that there was no reason not to proceed w/ surgery.  Letter sent to pt's surgeon.

## 2013-10-11 ENCOUNTER — Telehealth: Payer: Self-pay | Admitting: General Practice

## 2013-10-11 ENCOUNTER — Other Ambulatory Visit (INDEPENDENT_AMBULATORY_CARE_PROVIDER_SITE_OTHER): Payer: Medicare Other

## 2013-10-11 DIAGNOSIS — E876 Hypokalemia: Secondary | ICD-10-CM

## 2013-10-11 NOTE — Telephone Encounter (Signed)
Spoke with Dr. Thomasena Edis, anesthiologist in regards to pt. Stated that pt last potassium drawn on 10/6 was 3.2 and pt had advised she does not take her med daily. Advised Dr. Thomasena Edis that I would consult with Dr. Beverely Low in regards to matter.   Called Dr. Beverely Low and she advised that she is not familiar enough with the anesthesia process to make the determination if the potassium needs to be redrawn. Called and left a detailed message informing Dr. Thomasena Edis that we would be happy to contact pt to have labs redrawn if she felt the need.

## 2013-10-11 NOTE — Telephone Encounter (Signed)
Pt came in today to have labs redrawn. Advised Dr. Thomasena Edis that I would text the lab result once received.

## 2013-10-11 NOTE — Telephone Encounter (Signed)
Happy to redraw labs but will not make a determination whether pt can undergo anesthesia based on K+ values- I do not know enough about the medications used and how they impact K+ to make that decision.  Plus, lab value is nearly 65 weeks old.

## 2013-10-12 ENCOUNTER — Telehealth: Payer: Self-pay | Admitting: Family Medicine

## 2013-10-12 ENCOUNTER — Ambulatory Visit: Payer: Medicare Other | Admitting: Family Medicine

## 2013-10-12 LAB — POTASSIUM: Potassium: 3 mEq/L — ABNORMAL LOW (ref 3.5–5.1)

## 2013-10-12 MED ORDER — POTASSIUM CHLORIDE 20 MEQ PO PACK: 20.0000 meq | PACK | Freq: Two times a day (BID) | ORAL | Status: DC

## 2013-10-12 NOTE — Telephone Encounter (Signed)
Potassium filled for .

## 2013-10-12 NOTE — Addendum Note (Signed)
Addended by: Jackson Latino on: 10/12/2013 03:59 PM   Modules accepted: Orders

## 2013-10-12 NOTE — Telephone Encounter (Signed)
Pt's plastic surgeon, Dr Alois Cliche, called to discuss pt's K+ level of 3.0.  Pt's level was 3.2 in October and she was instructed to start Kdur daily.  Unbeknownst to our office, pt has not been taking these due to the size of the pill.  They are now asking what to do about her upcoming cosmetic surgery.  I advised surgeon that I cannot comment on whether pt should be placed under anesthesia w/ low K+ b/c I'm not familiar enough w/ the meds to know their effects on the K+ level.  But I could recommend increasing the Kdur to daily ( BID) until date of surgery to replete her levels in the setting of HCTZ use.  Called and spoke w/ pt about her K+ levels, discussed options- breaking tabs, crushing tabs, using the sprinkle packets.  Pt had a reason why she wasn't keen on any of the options but did agree to try taking a 20 meq packet twice daily.  Stressed the importance of normalizing her K+ to proceed w/ surgery.  Pt expressed understanding.  Prescription sent.

## 2013-10-14 ENCOUNTER — Telehealth: Payer: Self-pay

## 2013-10-14 NOTE — Telephone Encounter (Signed)
Sheppard Plumber handed me a lab order from another office for this pt. She has not seen a provider at [facility]. Please check her in as a new pt and give her proper paperwork. Also, do not fast track patient. She needs to see provider to order labs.   Order from outside office [facility] for [facility] Surgery) is in [place] "New Forms" box.  Thanks!  Becky

## 2013-10-15 ENCOUNTER — Telehealth: Payer: Self-pay

## 2013-10-16 ENCOUNTER — Ambulatory Visit (INDEPENDENT_AMBULATORY_CARE_PROVIDER_SITE_OTHER): Payer: Medicare Other | Admitting: Emergency Medicine

## 2013-10-16 VITALS — BP 144/80 | HR 86 | Temp 98.1°F | Resp 20 | Ht 63.75 in | Wt 168.6 lb

## 2013-10-16 DIAGNOSIS — E876 Hypokalemia: Secondary | ICD-10-CM

## 2013-10-16 LAB — BASIC METABOLIC PANEL
BUN: 10 mg/dL (ref 6–23)
Calcium: 9.4 mg/dL (ref 8.4–10.5)
Chloride: 105 mEq/L (ref 96–112)
Creat: 0.65 mg/dL (ref 0.50–1.10)
Sodium: 141 mEq/L (ref 135–145)

## 2013-10-16 NOTE — Progress Notes (Signed)
[facility] [address] [place] [address] [phone]  Date:  10/16/2013   Name:  Isabella Andersen   DOB:  1941/06/26   MRN:  [id number]  PCP:  Neena Rhymes, MD    Chief Complaint: Labs Only   History of Present Illness:  Isabella Andersen is a 72 y.o. very pleasant female patient who presents with the following:  History of hypokalemia and is scheduled for surgery tomorrow.  Needs labs   Patient Active Problem List   Diagnosis Date Noted  . Cough 09/21/2013  . Murmur 09/16/2012  . Chest pain 08/24/2012  . Anxiety and depression 07/14/2012  . General medical examination 10/22/2011  . Osteoporosis 09/09/2011  . GERD (gastroesophageal reflux disease)   . Hyperlipidemia   . Hypertension     Past Medical History  Diagnosis Date  . GERD (gastroesophageal reflux disease)   . Hyperlipidemia   . Hypertension   . Osteoporosis   . Cataract   . Heart murmur     Past Surgical History  Procedure Laterality Date  . Foot surgery    . Appendectomy    . Tonsillectomy and adenoidectomy    . Abdominal hysterectomy  1972  . Cataract extraction      both eyes  . Colonoscopy      History  Substance Use Topics  . Smoking status: Never Smoker   . Smokeless tobacco: Never Used  . Alcohol Use: No    Family History  Problem Relation Age of Onset  . Colon cancer Mother   . Colon polyps Sister   . Esophageal cancer Neg Hx   . Rectal cancer Neg Hx   . Stomach cancer Neg Hx   . Colon polyps Sister     No Known Allergies  Medication list has been reviewed and updated.  Current Outpatient Prescriptions on File Prior to Visit  Medication Sig Dispense Refill  . ALPRAZolam (XANAX) 0.25 MG tablet Take 1 tablet (0.25 mg total) by mouth 3 (three) times daily as needed for anxiety.  60 tablet  1  . hydrochlorothiazide (HYDRODIURIL) 25 MG tablet Take 1 tablet (25 mg total) by mouth daily.  90 tablet  3  . potassium chloride (KLOR-CON) 20 MEQ packet  Take 20 mEq by mouth 2 (two) times daily.  60 packet  0  . omeprazole (PRILOSEC) 40 MG capsule Take 1 capsule (40 mg total) by mouth daily.  90 capsule  3  . rosuvastatin (CRESTOR) 10 MG tablet Take 1 tablet (10 mg total) by mouth at bedtime.  90 tablet  3   No current facility-administered medications on file prior to visit.    Review of Systems:  As per HPI, otherwise negative.    Physical Examination: Filed Vitals:   10/16/13 1107  BP: 144/80  Pulse: 86  Temp: 98.1 F (36.7 C)  Resp: 20   Filed Vitals:   10/16/13 1107  Height: 5' 3.75" (1.619 m)  Weight: 168 lb 9.6 oz (76.476 kg)   Body mass index is 29.18 kg/(m^2). Ideal Body Weight: Weight in (lb) to have BMI = 25: 144.2   GEN: WDWN, NAD, Non-toxic, Alert & Oriented x 3 HEENT: Atraumatic, Normocephalic.  Ears and Nose: No external deformity. EXTR: No clubbing/cyanosis/edema NEURO: Normal gait.  PSYCH: Normally interactive. Conversant. Not depressed or anxious appearing.  Calm demeanor.    Assessment and Plan: preop clearance   Signed,  Phillips Odor, MD

## 2013-11-24 HISTORY — PX: BLEPHAROPLASTY: SUR158

## 2014-02-16 DIAGNOSIS — J0100 Acute maxillary sinusitis, unspecified: Secondary | ICD-10-CM | POA: Diagnosis not present

## 2014-03-02 ENCOUNTER — Encounter: Payer: Self-pay | Admitting: Family Medicine

## 2014-03-02 ENCOUNTER — Ambulatory Visit (HOSPITAL_BASED_OUTPATIENT_CLINIC_OR_DEPARTMENT_OTHER)
Admission: RE | Admit: 2014-03-02 | Discharge: 2014-03-02 | Disposition: A | Discharge status: Home / Self Care | Payer: Medicare Other | Source: Ambulatory Visit | Attending: Family Medicine | Admitting: Family Medicine

## 2014-03-02 ENCOUNTER — Telehealth: Payer: Self-pay | Admitting: Family Medicine

## 2014-03-02 ENCOUNTER — Ambulatory Visit (INDEPENDENT_AMBULATORY_CARE_PROVIDER_SITE_OTHER): Payer: Medicare Other | Admitting: Family Medicine

## 2014-03-02 VITALS — BP 140/88 | HR 90 | Temp 98.4°F | Resp 16 | Wt 164.5 lb

## 2014-03-02 DIAGNOSIS — R059 Cough, unspecified: Secondary | ICD-10-CM | POA: Diagnosis not present

## 2014-03-02 DIAGNOSIS — R05 Cough: Secondary | ICD-10-CM

## 2014-03-02 DIAGNOSIS — K449 Diaphragmatic hernia without obstruction or gangrene: Secondary | ICD-10-CM | POA: Diagnosis not present

## 2014-03-02 DIAGNOSIS — J309 Allergic rhinitis, unspecified: Secondary | ICD-10-CM

## 2014-03-02 DIAGNOSIS — E785 Hyperlipidemia, unspecified: Secondary | ICD-10-CM | POA: Diagnosis not present

## 2014-03-02 DIAGNOSIS — I10 Essential (primary) hypertension: Secondary | ICD-10-CM | POA: Diagnosis not present

## 2014-03-02 LAB — CBC WITH DIFFERENTIAL/PLATELET
BASOS PCT: 0.6 % (ref 0.0–3.0)
Basophils Absolute: 0 10*3/uL (ref 0.0–0.1)
EOS ABS: 0.1 10*3/uL (ref 0.0–0.7)
Eosinophils Relative: 1.8 % (ref 0.0–5.0)
HCT: 36.7 % (ref 36.0–46.0)
HEMOGLOBIN: 12.1 g/dL (ref 12.0–15.0)
Lymphocytes Relative: 35.8 % (ref 12.0–46.0)
Lymphs Abs: 1.9 10*3/uL (ref 0.7–4.0)
MCHC: 33 g/dL (ref 30.0–36.0)
MCV: 81.5 fl (ref 78.0–100.0)
MONO ABS: 0.5 10*3/uL (ref 0.1–1.0)
Monocytes Relative: 9.7 % (ref 3.0–12.0)
NEUTROS ABS: 2.7 10*3/uL (ref 1.4–7.7)
NEUTROS PCT: 52.1 % (ref 43.0–77.0)
Platelets: 339 10*3/uL (ref 150.0–400.0)
RBC: 4.5 Mil/uL (ref 3.87–5.11)
RDW: 14.5 % (ref 11.5–14.6)
WBC: 5.2 10*3/uL (ref 4.5–10.5)

## 2014-03-02 LAB — BASIC METABOLIC PANEL
BUN: 10 mg/dL (ref 6–23)
CHLORIDE: 103 meq/L (ref 96–112)
CO2: 26 mEq/L (ref 19–32)
Calcium: 9.4 mg/dL (ref 8.4–10.5)
Creatinine, Ser: 0.7 mg/dL (ref 0.4–1.2)
GFR: 89.21 mL/min (ref >60.00)
Glucose, Bld: 95 mg/dL (ref 70–99)
Potassium: 3.4 mEq/L — ABNORMAL LOW (ref 3.5–5.1)
Sodium: 138 mEq/L (ref 135–145)

## 2014-03-02 LAB — LIPID PANEL
CHOLESTEROL: 275 mg/dL — AB (ref 0–200)
HDL: 58.9 mg/dL (ref >39.00)
LDL Cholesterol: 187 mg/dL — ABNORMAL HIGH (ref 0–99)
Total CHOL/HDL Ratio: 5
Triglycerides: 147 mg/dL (ref 0.0–149.0)
VLDL: 29.4 mg/dL (ref 0.0–40.0)

## 2014-03-02 LAB — HEPATIC FUNCTION PANEL
ALT: 17 U/L (ref 0–35)
AST: 21 U/L (ref 0–37)
Albumin: 4.2 g/dL (ref 3.5–5.2)
Alkaline Phosphatase: 86 U/L (ref 39–117)
BILIRUBIN DIRECT: 0 mg/dL (ref 0.0–0.3)
BILIRUBIN TOTAL: 0.5 mg/dL (ref 0.3–1.2)
Total Protein: 7.1 g/dL (ref 6.0–8.3)

## 2014-03-02 MED ORDER — ALBUTEROL SULFATE (2.5 MG/3ML) 0.083% IN NEBU
2.5000 mg | INHALATION_SOLUTION | Freq: Once | RESPIRATORY_TRACT | Status: AC
  Administered 2014-03-02: 2.5 mg via RESPIRATORY_TRACT

## 2014-03-02 MED ORDER — ALBUTEROL SULFATE HFA 108 (90 BASE) MCG/ACT IN AERS: 2.0000 | INHALATION_SPRAY | RESPIRATORY_TRACT | Status: DC | PRN

## 2014-03-02 NOTE — Telephone Encounter (Signed)
4.9.15  Pt was returning call in regards to labs.  Pt said you could leave message on VM if you could not reach her.

## 2014-03-02 NOTE — Assessment & Plan Note (Signed)
New.  Start daily Claritin or Zyrtec to decrease PND which is likely the cause of cough.  Will follow.

## 2014-03-02 NOTE — Progress Notes (Signed)
   Subjective:    Patient ID: Isabella Andersen, female    DOB: 09-18-1941, 73 y.o.   MRN: [id number]  Cough Associated symptoms include shortness of breath.  Shortness of Breath   Cough- went to UC and was treated w/ Cefdinir and Hydromet 2 weeks ago.  No fevers.  + decreased energy.  + SOB.  Not taking anything for allergies.  No known sick contacts.  Cough is causing pt increased anxiety.    Hyperlipidemia- chronic problem, denies abd pain, N/V, myalgias  HTN- chronic problem, on HCTZ.  Due for labs.  Denies CP, + SOB (see above), no HAs, visual changes, edema   Review of Systems  Respiratory: Positive for cough and shortness of breath.    For ROS see HPI     Objective:   Physical Exam  Vitals reviewed. Constitutional: She is oriented to person, place, and time. She appears well-developed and well-nourished. No distress.  HENT:  Head: Normocephalic and atraumatic.  No TTP over sinuses + PND TMs WNL bilaterally  Eyes: Conjunctivae and EOM are normal. Pupils are equal, round, and reactive to light.  Neck: Normal range of motion. Neck supple. No thyromegaly present.  Cardiovascular: Normal rate, regular rhythm, normal heart sounds and intact distal pulses.   No murmur heard. Pulmonary/Chest: Effort normal and breath sounds normal. No respiratory distress.  Abdominal: Soft. She exhibits no distension. There is no tenderness.  Musculoskeletal: She exhibits no edema.  Lymphadenopathy:    She has no cervical adenopathy.  Neurological: She is alert and oriented to person, place, and time.  Skin: Skin is warm and dry.  Psychiatric: Her behavior is normal.  anxious          Assessment & Plan:

## 2014-03-02 NOTE — Telephone Encounter (Signed)
Pt called and notified of results.

## 2014-03-02 NOTE — Assessment & Plan Note (Addendum)
Deteriorated.  Get CXR to assess.  Suspect there is a component of RAD.  Also pt w/ copious PND which is likely triggering cough.  Air movement improved s/p neb tx.

## 2014-03-02 NOTE — Progress Notes (Signed)
Pre visit review using our clinic review tool, if applicable. No additional management support is needed unless otherwise documented below in the visit note. 

## 2014-03-02 NOTE — Assessment & Plan Note (Signed)
Chronic problem, adequate control today on HCTZ.  Check labs.  No anticipated med changes.

## 2014-03-02 NOTE — Patient Instructions (Signed)
Go to the [facility] and get your chest xray- we'll notify you of your results We'll notify you of your lab results and make any changes if needed Start Claritin or Zyrtec daily to decrease the post-nasal drip which is triggering your cough Use the albuterol inhaler- 2 puffs every 4 hrs as needed for coughing or shortness of breath Call with any questions or concerns Hang in there!

## 2014-03-02 NOTE — Assessment & Plan Note (Signed)
Chronic problem.  Tolerating statin w/o difficulty.  Check labs.  Adjust meds prn  

## 2014-03-03 ENCOUNTER — Other Ambulatory Visit: Payer: Self-pay | Admitting: Family Medicine

## 2014-03-03 DIAGNOSIS — E785 Hyperlipidemia, unspecified: Secondary | ICD-10-CM

## 2014-03-03 MED ORDER — ROSUVASTATIN CALCIUM 20 MG PO TABS: 20.0000 mg | ORAL_TABLET | Freq: Every day | ORAL | Status: DC

## 2014-03-03 MED ORDER — HYDROCHLOROTHIAZIDE 25 MG PO TABS: 25.0000 mg | ORAL_TABLET | Freq: Every day | ORAL | Status: DC

## 2014-04-18 ENCOUNTER — Encounter: Payer: Self-pay | Admitting: Lab

## 2014-04-18 ENCOUNTER — Other Ambulatory Visit (INDEPENDENT_AMBULATORY_CARE_PROVIDER_SITE_OTHER): Payer: Medicare Other

## 2014-04-18 DIAGNOSIS — E785 Hyperlipidemia, unspecified: Secondary | ICD-10-CM | POA: Diagnosis not present

## 2014-04-18 LAB — HEPATIC FUNCTION PANEL
ALK PHOS: 83 U/L (ref 39–117)
ALT: 14 U/L (ref 0–35)
AST: 21 U/L (ref 0–37)
Albumin: 3.9 g/dL (ref 3.5–5.2)
BILIRUBIN DIRECT: 0 mg/dL (ref 0.0–0.3)
Total Bilirubin: 0.4 mg/dL (ref 0.2–1.2)
Total Protein: 6.8 g/dL (ref 6.0–8.3)

## 2014-05-16 ENCOUNTER — Other Ambulatory Visit: Payer: Self-pay

## 2014-05-16 DIAGNOSIS — Z1231 Encounter for screening mammogram for malignant neoplasm of breast: Secondary | ICD-10-CM

## 2014-05-23 ENCOUNTER — Ambulatory Visit
Admission: RE | Admit: 2014-05-23 | Discharge: 2014-05-23 | Disposition: A | Discharge status: Home / Self Care | Payer: Medicare Other | Source: Ambulatory Visit

## 2014-05-23 DIAGNOSIS — Z1231 Encounter for screening mammogram for malignant neoplasm of breast: Secondary | ICD-10-CM

## 2014-08-22 ENCOUNTER — Encounter: Payer: Self-pay | Admitting: Family Medicine

## 2014-08-22 ENCOUNTER — Ambulatory Visit (INDEPENDENT_AMBULATORY_CARE_PROVIDER_SITE_OTHER): Payer: Medicare Other | Admitting: Family Medicine

## 2014-08-22 VITALS — BP 140/84 | HR 87 | Temp 98.0°F | Resp 16 | Wt 160.4 lb

## 2014-08-22 DIAGNOSIS — M79652 Pain in left thigh: Secondary | ICD-10-CM

## 2014-08-22 DIAGNOSIS — M79609 Pain in unspecified limb: Secondary | ICD-10-CM

## 2014-08-22 MED ORDER — PREDNISONE 10 MG PO TABS: ORAL_TABLET | ORAL | Status: DC

## 2014-08-22 NOTE — Progress Notes (Signed)
   Subjective:    Patient ID: Isabella Andersen, female    DOB: 01-14-1941, 73 y.o.   MRN: [id number]  HPI Leg pain- L leg, sxs started ~1 month ago.  Pain is localized to anterior/lateral thigh.  Painful to lie on.  Throbbing.  Will intermittently radiate into lower leg.  No known injury.  Pt was walking 4 miles/5 days a week.  Stopped walking a week ago but this has not improved pain.  Temporarily stopped Crestor but no improvement in pain.   Review of Systems For ROS see HPI     Objective:   Physical Exam  Vitals reviewed. Constitutional: She is oriented to person, place, and time. She appears well-developed and well-nourished. No distress.  Cardiovascular: Intact distal pulses.   Musculoskeletal: She exhibits tenderness (TTP over L greater trochanteric bursa). She exhibits no edema.  Full ROM of L hip w/o pain  Neurological: She is alert and oriented to person, place, and time. She has normal reflexes. No cranial nerve deficit. Coordination (antalgic gait) normal.  Skin: Skin is warm and dry. No erythema.  Psychiatric: She has a normal mood and affect. Her behavior is normal.          Assessment & Plan:

## 2014-08-22 NOTE — Progress Notes (Signed)
Pre visit review using our clinic review tool, if applicable. No additional management support is needed unless otherwise documented below in the visit note. 

## 2014-08-22 NOTE — Patient Instructions (Signed)
Schedule your complete physical for the next available (my appointments are booked out quite far and we're coming due) Start the prednisone as directed- take w/ food Alternate heat/ice Make sure you are wearing good, supportive shoes If no improvement or things are worsening, please call and we can do a Sports Med referral Elbert Ewings in there!!!

## 2014-08-22 NOTE — Assessment & Plan Note (Signed)
New.  Suspect this is L greater trochanteric bursitis w/ radiation of pain into anterior thigh.  No abnormality on L hip exam.  Reflexes and pulses intact.  Start pred taper.  Dicussed activity modification/rest.  Alternate ice/heat.  Reviewed supportive care and red flags that should prompt return.  Pt expressed understanding and is in agreement w/ plan.

## 2014-09-12 DIAGNOSIS — Z23 Encounter for immunization: Secondary | ICD-10-CM | POA: Diagnosis not present

## 2014-09-21 DIAGNOSIS — M7062 Trochanteric bursitis, left hip: Secondary | ICD-10-CM | POA: Diagnosis not present

## 2014-09-21 DIAGNOSIS — M545 Low back pain: Secondary | ICD-10-CM | POA: Diagnosis not present

## 2014-11-27 DIAGNOSIS — B029 Zoster without complications: Secondary | ICD-10-CM | POA: Diagnosis not present

## 2014-12-21 ENCOUNTER — Telehealth: Payer: Self-pay | Admitting: *Deleted

## 2014-12-21 ENCOUNTER — Encounter: Payer: Self-pay | Admitting: *Deleted

## 2014-12-21 NOTE — Telephone Encounter (Addendum)
Pre-Visit Call:   Reviewed allergies, medications, health history, and health maintenance with patient and made changes as necessary.   Preferred pharmacy: [id number] - [place], [address]  Pap- does not remember last, Hysterectomy at age 74 [facility]- 12/23/12 ([facility])- 2 sessile polyps removed, one semi pedunculated polyp removed, repeat every 3 years [facility]- 05/23/14 [facility])- BI-RADS CATEGORY 1: Neg BD- 12/25/09- no results found, does not remember results (but hx of Osteoporosis)  Flu- 09/14/14 UTD Td 11/25/07- UTD Pna- 11/25/07 (interested in getting Prevnar) Zoster- has not had, but currently has shingles  Patient currently has shingles (x 1 month) and has already seen dermatologist about this and given medication (prednisone and one unknown).  She just wants to make PCP aware.   She complains of leg pain at night that she states is throbbing, not cramping or tingling/numb.  "My legs hurt in my bones." Occasionally relieved by walking or Ibuprofen.   She would also like a Rx for generic version of Crestor due to cost.

## 2014-12-22 ENCOUNTER — Ambulatory Visit (INDEPENDENT_AMBULATORY_CARE_PROVIDER_SITE_OTHER): Payer: Medicare Other | Admitting: Family Medicine

## 2014-12-22 ENCOUNTER — Other Ambulatory Visit: Payer: Self-pay | Admitting: General Practice

## 2014-12-22 ENCOUNTER — Encounter: Payer: Self-pay | Admitting: Family Medicine

## 2014-12-22 VITALS — BP 130/82 | HR 95 | Temp 98.3°F | Resp 16 | Ht 64.25 in | Wt 162.2 lb

## 2014-12-22 DIAGNOSIS — I10 Essential (primary) hypertension: Secondary | ICD-10-CM

## 2014-12-22 DIAGNOSIS — M810 Age-related osteoporosis without current pathological fracture: Secondary | ICD-10-CM | POA: Diagnosis not present

## 2014-12-22 DIAGNOSIS — Z23 Encounter for immunization: Secondary | ICD-10-CM | POA: Diagnosis not present

## 2014-12-22 DIAGNOSIS — E785 Hyperlipidemia, unspecified: Secondary | ICD-10-CM

## 2014-12-22 DIAGNOSIS — G4700 Insomnia, unspecified: Secondary | ICD-10-CM

## 2014-12-22 DIAGNOSIS — Z0000 Encounter for general adult medical examination without abnormal findings: Secondary | ICD-10-CM | POA: Diagnosis not present

## 2014-12-22 LAB — TSH: TSH: 2.49 u[IU]/mL (ref 0.35–4.50)

## 2014-12-22 LAB — CBC WITH DIFFERENTIAL/PLATELET
BASOS PCT: 0.3 % (ref 0.0–3.0)
Basophils Absolute: 0 10*3/uL (ref 0.0–0.1)
Eosinophils Absolute: 0.1 10*3/uL (ref 0.0–0.7)
Eosinophils Relative: 2.8 % (ref 0.0–5.0)
HCT: 35.8 % — ABNORMAL LOW (ref 36.0–46.0)
Hemoglobin: 12.1 g/dL (ref 12.0–15.0)
LYMPHS ABS: 1.4 10*3/uL (ref 0.7–4.0)
LYMPHS PCT: 32.5 % (ref 12.0–46.0)
MCHC: 33.6 g/dL (ref 30.0–36.0)
MCV: 80.3 fl (ref 78.0–100.0)
MONO ABS: 0.4 10*3/uL (ref 0.1–1.0)
Monocytes Relative: 9 % (ref 3.0–12.0)
Neutro Abs: 2.4 10*3/uL (ref 1.4–7.7)
Neutrophils Relative %: 55.4 % (ref 43.0–77.0)
Platelets: 271 10*3/uL (ref 150.0–400.0)
RBC: 4.47 Mil/uL (ref 3.87–5.11)
RDW: 14.4 % (ref 11.5–15.5)
WBC: 4.4 10*3/uL (ref 4.0–10.5)

## 2014-12-22 LAB — BASIC METABOLIC PANEL
BUN: 14 mg/dL (ref 6–23)
CALCIUM: 9.4 mg/dL (ref 8.4–10.5)
CHLORIDE: 104 meq/L (ref 96–112)
CO2: 26 meq/L (ref 19–32)
Creatinine, Ser: 0.72 mg/dL (ref 0.40–1.20)
GFR: 84.74 mL/min (ref >60.00)
Glucose, Bld: 101 mg/dL — ABNORMAL HIGH (ref 70–99)
POTASSIUM: 3.9 meq/L (ref 3.5–5.1)
Sodium: 140 mEq/L (ref 135–145)

## 2014-12-22 LAB — LIPID PANEL
Cholesterol: 203 mg/dL — ABNORMAL HIGH (ref 0–200)
HDL: 73.2 mg/dL (ref >39.00)
LDL CALC: 104 mg/dL — AB (ref 0–99)
NonHDL: 129.8
Total CHOL/HDL Ratio: 3
Triglycerides: 128 mg/dL (ref 0.0–149.0)
VLDL: 25.6 mg/dL (ref 0.0–40.0)

## 2014-12-22 LAB — HEPATIC FUNCTION PANEL
ALT: 15 U/L (ref 0–35)
AST: 18 U/L (ref 0–37)
Albumin: 4.5 g/dL (ref 3.5–5.2)
Alkaline Phosphatase: 100 U/L (ref 39–117)
BILIRUBIN TOTAL: 0.4 mg/dL (ref 0.2–1.2)
Bilirubin, Direct: 0.1 mg/dL (ref 0.0–0.3)
Total Protein: 7.1 g/dL (ref 6.0–8.3)

## 2014-12-22 LAB — VITAMIN D 25 HYDROXY (VIT D DEFICIENCY, FRACTURES): VITD: 22.82 ng/mL — ABNORMAL LOW (ref 30.00–100.00)

## 2014-12-22 MED ORDER — ATORVASTATIN CALCIUM 40 MG PO TABS: 40.0000 mg | ORAL_TABLET | Freq: Every day | ORAL | Status: DC

## 2014-12-22 MED ORDER — VITAMIN D (ERGOCALCIFEROL) 1.25 MG (50000 UNIT) PO CAPS: 50000.0000 [IU] | ORAL_CAPSULE | ORAL | Status: DC

## 2014-12-22 MED ORDER — CLONAZEPAM 0.5 MG PO TABS: 0.5000 mg | ORAL_TABLET | Freq: Every day | ORAL | Status: DC

## 2014-12-22 NOTE — Assessment & Plan Note (Signed)
Pt's PE WNL.  UTD on colonoscopy and mammo- due for DEXA (order entered).  Written screening schedule updated and given to pt.  Check labs.  Anticipatory guidance provided.

## 2014-12-22 NOTE — Patient Instructions (Signed)
Follow up in 6 months to recheck BP and cholesterol We'll notify you of your lab results and make any changes if needed Keep up the good work on healthy diet and regular exercise We'll call you with your bone density appt Start the Clonazepam nightly for sleep We'll switch the Crestor to Lipitor once we have your lab results Call with any questions or concerns Happy New Year!!!

## 2014-12-22 NOTE — Assessment & Plan Note (Signed)
New.  Start Clonopin for insomnia and RLS.  Reviewed possible side effects, including dizziness and confusion.  Will follow.

## 2014-12-22 NOTE — Assessment & Plan Note (Signed)
Chronic problem.  Adequate control.  Asymptomatic.  Check labs.  No anticipated med changes 

## 2014-12-22 NOTE — Progress Notes (Signed)
   Subjective:    Patient ID: Isabella Andersen, female    DOB: 07-21-1941, 74 y.o.   MRN: [id number]  HPI Here today for CPE.  Risk Factors: HTN- chronic problem, on HCTZ.  No CP, SOB, HAs, visual changes, edema. Hyperlipidemia- chronic problem, on Crestor.  Crestor is too expensive.  Denies abd pain, N/V. Osteoporosis- due for DEXA.  Not taking Ca and Vit D due to size of pills.  Previously on Fosamax and Actonel but has severe GERD. Physical Activity: exercising regularly Fall Risk: low risk Depression: denies current sxs Hearing: normal to conversational tones, mildly decreased to whispered voice ADL's: independent Cognitive: normal linear thought process, memory and attention intact Home Safety: safe at home, lives w/ husband Height, Weight, BMI, Visual Acuity: see vitals, vision corrected to 20/20 w/ glasses Counseling: UTD on colonoscopy, mammo.  Due for [place] .  No need for paps. Labs Ordered: See A&P Care Plan: See A&P    Review of Systems Patient reports no vision/ hearing changes, adenopathy,fever, weight change,  persistant/recurrent hoarseness , swallowing issues, chest pain, palpitations, edema, persistant/recurrent cough, hemoptysis, dyspnea (rest/exertional/paroxysmal nocturnal), gastrointestinal bleeding (melena, rectal bleeding), abdominal pain, significant heartburn, bowel changes, GU symptoms (dysuria, hematuria, incontinence), Gyn symptoms (abnormal  bleeding, pain),  syncope, focal weakness, memory loss, numbness & tingling, skin/hair/nail changes, abnormal bruising or bleeding, anxiety, or depression.   + insomnia, RLS  Reviewed meds, allergies, problem list, and PMH in chart     Objective:   Physical Exam General Appearance:    Alert, cooperative, no distress, appears stated age  Head:    Normocephalic, without obvious abnormality, atraumatic  Eyes:    PERRL, conjunctiva/corneas clear, EOM's intact, fundi    benign, both eyes  Ears:    Normal  TM's and external ear canals, both ears  Nose:   Nares normal, septum midline, mucosa normal, no drainage    or sinus tenderness  Throat:   Lips, mucosa, and tongue normal; teeth and gums normal  Neck:   Supple, symmetrical, trachea midline, no adenopathy;    Thyroid: no enlargement/tenderness/nodules  Back:     Symmetric, no curvature, ROM normal, no CVA tenderness  Lungs:     Clear to auscultation bilaterally, respirations unlabored  Chest Wall:    No tenderness or deformity   Heart:    Regular rate and rhythm, S1 and S2 normal, no murmur, rub   or gallop  Breast Exam:    Deferred to mammo  Abdomen:     Soft, non-tender, bowel sounds active all four quadrants,    no masses, no organomegaly  Genitalia:    Deferred  Rectal:    Extremities:   Extremities normal, atraumatic, no cyanosis or edema  Pulses:   2+ and symmetric all extremities  Skin:   Skin color, texture, turgor normal, no rashes or lesions  Lymph nodes:   Cervical, supraclavicular, and axillary nodes normal  Neurologic:   CNII-XII intact, normal strength, sensation and reflexes    throughout          Assessment & Plan:

## 2014-12-22 NOTE — Assessment & Plan Note (Signed)
Chronic problem.  Tolerating statin w/o difficulty but is having trouble w/ the cost.  Based on this, plan is to switch to generic lipitor once lab results are available to determine dosing.  Pt expressed understanding and is in agreement w/ plan.

## 2014-12-22 NOTE — Progress Notes (Signed)
Pre visit review using our clinic review tool, if applicable. No additional management support is needed unless otherwise documented below in the visit note. 

## 2014-12-22 NOTE — Assessment & Plan Note (Signed)
Pt due for DEXA.  Previously on bisphosphonates but this worsened reflux.  Not taking Ca and Vit D due to size of pills.  Discussed that she can take Ca chews and Vit D are small capsules.  Check Vit D level, replete prn.

## 2014-12-26 ENCOUNTER — Telehealth: Payer: Self-pay | Admitting: Family Medicine

## 2014-12-26 NOTE — Telephone Encounter (Signed)
Caller name: Josey Dettmann Relationship to patient: self Can be reached:[phone]  Pharmacy:  Reason for call: Requesting lab results and would like copy mailed to her as well

## 2014-12-27 NOTE — Telephone Encounter (Signed)
Called patient and notified her of lab results.  Patient stated understanding.  She has already picked up prescriptions and is taking them as prescribed.  She states she does not need a letter since she was told the labs she was curious about.  eal

## 2014-12-27 NOTE — Telephone Encounter (Signed)
Pt was notified per mychart, could you call and discuss with her?

## 2015-01-03 ENCOUNTER — Ambulatory Visit (INDEPENDENT_AMBULATORY_CARE_PROVIDER_SITE_OTHER)
Admission: RE | Admit: 2015-01-03 | Discharge: 2015-01-03 | Disposition: A | Discharge status: Home / Self Care | Payer: Medicare Other | Source: Ambulatory Visit | Attending: Family Medicine | Admitting: Family Medicine

## 2015-01-03 DIAGNOSIS — M810 Age-related osteoporosis without current pathological fracture: Secondary | ICD-10-CM

## 2015-06-21 ENCOUNTER — Ambulatory Visit: Payer: Medicare Other | Admitting: Family Medicine

## 2015-06-27 ENCOUNTER — Encounter: Payer: Self-pay | Admitting: General Practice

## 2015-06-27 ENCOUNTER — Encounter: Payer: Self-pay | Admitting: Family Medicine

## 2015-06-27 ENCOUNTER — Ambulatory Visit (INDEPENDENT_AMBULATORY_CARE_PROVIDER_SITE_OTHER): Payer: Medicare Other | Admitting: Family Medicine

## 2015-06-27 VITALS — BP 130/82 | HR 88 | Temp 98.1°F | Resp 16 | Ht 64.0 in | Wt 163.0 lb

## 2015-06-27 DIAGNOSIS — F419 Anxiety disorder, unspecified: Secondary | ICD-10-CM

## 2015-06-27 DIAGNOSIS — I10 Essential (primary) hypertension: Secondary | ICD-10-CM | POA: Diagnosis not present

## 2015-06-27 DIAGNOSIS — F32A Depression, unspecified: Secondary | ICD-10-CM

## 2015-06-27 DIAGNOSIS — E785 Hyperlipidemia, unspecified: Secondary | ICD-10-CM

## 2015-06-27 DIAGNOSIS — F418 Other specified anxiety disorders: Secondary | ICD-10-CM

## 2015-06-27 DIAGNOSIS — F329 Major depressive disorder, single episode, unspecified: Secondary | ICD-10-CM

## 2015-06-27 LAB — BASIC METABOLIC PANEL
BUN: 14 mg/dL (ref 6–23)
CO2: 29 mEq/L (ref 19–32)
CREATININE: 0.65 mg/dL (ref 0.40–1.20)
Calcium: 9.2 mg/dL (ref 8.4–10.5)
Chloride: 104 mEq/L (ref 96–112)
GFR: 95.22 mL/min (ref >60.00)
Glucose, Bld: 92 mg/dL (ref 70–99)
POTASSIUM: 3.5 meq/L (ref 3.5–5.1)
Sodium: 141 mEq/L (ref 135–145)

## 2015-06-27 LAB — LIPID PANEL
Cholesterol: 210 mg/dL — ABNORMAL HIGH (ref 0–200)
HDL: 70.4 mg/dL (ref >39.00)
LDL CALC: 118 mg/dL — AB (ref 0–99)
NONHDL: 139.66
Total CHOL/HDL Ratio: 3
Triglycerides: 106 mg/dL (ref 0.0–149.0)
VLDL: 21.2 mg/dL (ref 0.0–40.0)

## 2015-06-27 LAB — CBC WITH DIFFERENTIAL/PLATELET
Basophils Absolute: 0 10*3/uL (ref 0.0–0.1)
Basophils Relative: 0.6 % (ref 0.0–3.0)
EOS ABS: 0.1 10*3/uL (ref 0.0–0.7)
Eosinophils Relative: 2.7 % (ref 0.0–5.0)
HCT: 36.7 % (ref 36.0–46.0)
HEMOGLOBIN: 12.3 g/dL (ref 12.0–15.0)
Lymphocytes Relative: 28 % (ref 12.0–46.0)
Lymphs Abs: 1.4 10*3/uL (ref 0.7–4.0)
MCHC: 33.6 g/dL (ref 30.0–36.0)
MCV: 81 fl (ref 78.0–100.0)
MONOS PCT: 9.4 % (ref 3.0–12.0)
Monocytes Absolute: 0.5 10*3/uL (ref 0.1–1.0)
Neutro Abs: 3 10*3/uL (ref 1.4–7.7)
Neutrophils Relative %: 59.3 % (ref 43.0–77.0)
Platelets: 252 10*3/uL (ref 150.0–400.0)
RBC: 4.53 Mil/uL (ref 3.87–5.11)
RDW: 15 % (ref 11.5–15.5)
WBC: 5.1 10*3/uL (ref 4.0–10.5)

## 2015-06-27 LAB — HEPATIC FUNCTION PANEL
ALBUMIN: 4.4 g/dL (ref 3.5–5.2)
ALT: 18 U/L (ref 0–35)
AST: 21 U/L (ref 0–37)
Alkaline Phosphatase: 88 U/L (ref 39–117)
Bilirubin, Direct: 0 mg/dL (ref 0.0–0.3)
Total Bilirubin: 0.4 mg/dL (ref 0.2–1.2)
Total Protein: 6.9 g/dL (ref 6.0–8.3)

## 2015-06-27 MED ORDER — CLONAZEPAM 0.5 MG PO TABS: 0.5000 mg | ORAL_TABLET | Freq: Every day | ORAL | Status: DC

## 2015-06-27 NOTE — Progress Notes (Signed)
   Subjective:    Patient ID: Isabella Andersen, female    DOB: 1941-10-06, 74 y.o.   MRN: [id number]  HPI HTN- chronic problem, on HCTZ.  Adequate control.  No CP, SOB, HAs, visual changes, edema.  Not taking K+ packets- unable to take pills due to size.  + leg cramps.  Hyperlipidemia- chronic problem, on Lipitor.  Denies abd pain, N/V, myalgias.  Anxiety- pt is having difficulty w/ daughter and strained relationship.  Pt is asking for refill on Klonopin.   Review of Systems For ROS see HPI     Objective:   Physical Exam  Constitutional: She is oriented to person, place, and time. She appears well-developed and well-nourished. No distress.  HENT:  Head: Normocephalic and atraumatic.  Eyes: Conjunctivae and EOM are normal. Pupils are equal, round, and reactive to light.  Neck: Normal range of motion. Neck supple. No thyromegaly present.  Cardiovascular: Normal rate, regular rhythm, normal heart sounds and intact distal pulses.   No murmur heard. Pulmonary/Chest: Effort normal and breath sounds normal. No respiratory distress.  Abdominal: Soft. She exhibits no distension. There is no tenderness.  Musculoskeletal: She exhibits no edema.  Lymphadenopathy:    She has no cervical adenopathy.  Neurological: She is alert and oriented to person, place, and time.  Skin: Skin is warm and dry.  Psychiatric: She has a normal mood and affect. Her behavior is normal.  Vitals reviewed.         Assessment & Plan:

## 2015-06-27 NOTE — Assessment & Plan Note (Signed)
Ongoing problem for pt.  Recently worse w/ strained relationship w/ daughter.  Refill provided on Klonopin and encouraged pt to work on finding a stress outlet

## 2015-06-27 NOTE — Assessment & Plan Note (Signed)
Chronic problem.  Tolerating statin w/o difficulty.  Check labs.  Adjust meds prn  

## 2015-06-27 NOTE — Progress Notes (Signed)
Pre visit review using our clinic review tool, if applicable. No additional management support is needed unless otherwise documented below in the visit note. 

## 2015-06-27 NOTE — Patient Instructions (Signed)
Schedule your complete physical in 6 months We'll notify you of your lab results and make any changes if needed Use the Clonazepam as needed for anxiety/stress Keep up the good work!  You look great! Call with any questions or concerns Enjoy the rest of your summer!

## 2015-06-27 NOTE — Assessment & Plan Note (Signed)
Chronic problem.  Adequate control on HCTZ.  Check labs.  No anticipated med changes.

## 2015-06-28 ENCOUNTER — Telehealth: Payer: Self-pay | Admitting: Family Medicine

## 2015-06-28 MED ORDER — ALPRAZOLAM 0.25 MG PO TABS: 0.2500 mg | ORAL_TABLET | Freq: Two times a day (BID) | ORAL | Status: DC | PRN

## 2015-06-28 NOTE — Telephone Encounter (Signed)
Spoke with pt and advised that per PCP, she should take Rx to pharmacy. They can either hold for future refills or can shred for pt. Alprazolam refill filled for pt as requested.

## 2015-06-28 NOTE — Telephone Encounter (Signed)
Caller name:Overall, Arbie Cookey Relation to [place]:[facility] Call back [phone] Pharmacy:[facility]  Reason for call: pt states she was seen yesterday, states Dr. Darene Lamer gave her an rx for clonazePAM (KLONOPIN) 0.5 MG tablet and she already has that one, pt states she should have gotten a rx for Xanax which is the one she is out of, pt still has the rx would like to know if she can bring it back on Monday and pick up the new rx.

## 2015-08-08 DIAGNOSIS — H524 Presbyopia: Secondary | ICD-10-CM | POA: Diagnosis not present

## 2015-08-08 DIAGNOSIS — H1033 Unspecified acute conjunctivitis, bilateral: Secondary | ICD-10-CM | POA: Diagnosis not present

## 2015-09-27 ENCOUNTER — Ambulatory Visit (INDEPENDENT_AMBULATORY_CARE_PROVIDER_SITE_OTHER): Payer: Medicare Other | Admitting: Medical

## 2015-09-27 ENCOUNTER — Encounter: Payer: Self-pay | Admitting: Medical

## 2015-09-27 VITALS — BP 130/80 | HR 77 | Temp 98.0°F | Ht 64.0 in | Wt 161.0 lb

## 2015-09-27 DIAGNOSIS — M25519 Pain in unspecified shoulder: Secondary | ICD-10-CM | POA: Diagnosis not present

## 2015-09-27 DIAGNOSIS — M25512 Pain in left shoulder: Secondary | ICD-10-CM | POA: Diagnosis not present

## 2015-09-27 DIAGNOSIS — K2100 Gastro-esophageal reflux disease with esophagitis, without bleeding: Secondary | ICD-10-CM

## 2015-09-27 DIAGNOSIS — R1013 Epigastric pain: Secondary | ICD-10-CM | POA: Diagnosis not present

## 2015-09-27 LAB — COMPREHENSIVE METABOLIC PANEL
ALK PHOS: 89 U/L (ref 33–130)
ALT: 18 U/L (ref 6–29)
AST: 21 U/L (ref 10–35)
Albumin: 4.5 g/dL (ref 3.6–5.1)
BILIRUBIN TOTAL: 0.5 mg/dL (ref 0.2–1.2)
BUN: 9 mg/dL (ref 7–25)
CO2: 26 mmol/L (ref 20–31)
Calcium: 9.1 mg/dL (ref 8.6–10.4)
Chloride: 101 mmol/L (ref 98–110)
Creat: 0.64 mg/dL (ref 0.60–0.93)
GLUCOSE: 109 mg/dL — AB (ref 65–99)
POTASSIUM: 3.8 mmol/L (ref 3.5–5.3)
Sodium: 139 mmol/L (ref 135–146)
Total Protein: 6.6 g/dL (ref 6.1–8.1)

## 2015-09-27 LAB — CBC WITH DIFFERENTIAL/PLATELET
BASOS PCT: 0 % (ref 0–1)
Basophils Absolute: 0 10*3/uL (ref 0.0–0.1)
Eosinophils Absolute: 0.1 10*3/uL (ref 0.0–0.7)
Eosinophils Relative: 2 % (ref 0–5)
HCT: 37.3 % (ref 36.0–46.0)
HEMOGLOBIN: 12.2 g/dL (ref 12.0–15.0)
LYMPHS ABS: 1.4 10*3/uL (ref 0.7–4.0)
Lymphocytes Relative: 25 % (ref 12–46)
MCH: 26.5 pg (ref 26.0–34.0)
MCHC: 32.7 g/dL (ref 30.0–36.0)
MCV: 80.9 fL (ref 78.0–100.0)
MONO ABS: 0.6 10*3/uL (ref 0.1–1.0)
MONOS PCT: 10 % (ref 3–12)
MPV: 9.7 fL (ref 8.6–12.4)
NEUTROS ABS: 3.5 10*3/uL (ref 1.7–7.7)
Neutrophils Relative %: 63 % (ref 43–77)
Platelets: 314 10*3/uL (ref 150–400)
RBC: 4.61 MIL/uL (ref 3.87–5.11)
RDW: 14.7 % (ref 11.5–15.5)
WBC: 5.5 10*3/uL (ref 4.0–10.5)

## 2015-09-27 LAB — AMYLASE: AMYLASE: 26 U/L (ref 0–105)

## 2015-09-27 LAB — LIPASE: Lipase: 15 U/L (ref 7–60)

## 2015-09-27 LAB — TROPONIN I: Troponin I: <0.01 ng/mL (ref <0.06)

## 2015-09-27 MED ORDER — RANITIDINE HCL 150 MG PO CAPS: 150.0000 mg | ORAL_CAPSULE | Freq: Two times a day (BID) | ORAL | Status: DC

## 2015-09-27 MED ORDER — GI COCKTAIL ~~LOC~~
30.0000 mL | Freq: Once | ORAL | Status: AC
  Administered 2015-09-27: 30 mL via ORAL

## 2015-09-27 NOTE — Progress Notes (Signed)
Pre visit review using our clinic review tool, if applicable. No additional management support is needed unless otherwise documented below in the visit note. 

## 2015-09-27 NOTE — Progress Notes (Signed)
Subjective:    Patient ID: Isabella Andersen, female    DOB: January 27, 1941, 74 y.o.   MRN: [id number]  HPI  Pt in states she had recent flare of her gerd. She is on prilosec but does not help much at all.  Pt has been taking her prilosec daily.   Pt states these symptoms have been present since last Thursday. Pt states has burn sensation toward her neck all the way up to suprasternal notch reason.  Pt states she is fatigued.   Pt also states after her reflux occurs will have shoulder pain. But none today.  Pt states last time she had reflux pain was last night.  Yesterday pt ate pimento cheese sandwich and felt the pain.   Pt states every day since after eating would get the pain in stomach and would radiate to her left shoulder. But today ate small amount this am. Did not bother her stomach and no shoulder pain.  Pt never had ekg.  No jaw pain, no sweating episodes, no sob. Pt does have hyperlipidemia.    Review of Systems  Constitutional: Negative for chills, diaphoresis and fatigue.  Respiratory: Negative for cough, chest tightness, shortness of breath and wheezing.   Cardiovascular: Negative for chest pain and palpitations.  Gastrointestinal: Positive for nausea and abdominal pain. Negative for vomiting, diarrhea, constipation, blood in stool, abdominal distention and anal bleeding.  Musculoskeletal:       Lt shoulder pain.  Neurological: Negative for dizziness, tremors, seizures, numbness and headaches.  Hematological: Negative for adenopathy. Does not bruise/bleed easily.    Past Medical History  Diagnosis Date  . GERD (gastroesophageal reflux disease)   . Hyperlipidemia   . Hypertension   . Osteoporosis   . Cataract   . Heart murmur   . Shingles     Social History   Social History  . Marital Status: Married    Spouse Name: N/A  . Number of Children: N/A  . Years of Education: N/A   Occupational History  . Not on file.   Social History Main Topics  .  Smoking status: Never Smoker   . Smokeless tobacco: Never Used  . Alcohol Use: No  . Drug Use: No  . Sexual Activity: Not on file   Other Topics Concern  . Not on file   Social History Narrative    Past Surgical History  Procedure Laterality Date  . Foot surgery    . Appendectomy    . Tonsillectomy and adenoidectomy    . Abdominal hysterectomy  1972  . Cataract extraction      both eyes  . Colonoscopy    . Blepharoplasty  2015    Family History  Problem Relation Age of Onset  . Colon cancer Mother     died of colon ca  . Colon polyps Sister   . Esophageal cancer Neg Hx   . Rectal cancer Neg Hx   . Stomach cancer Neg Hx   . Colon polyps Sister   . Heart disease Father     died of heart attack  . Heart disease Maternal Grandmother   . Heart disease Maternal Grandfather     No Known Allergies  Current Outpatient Prescriptions on File Prior to Visit  Medication Sig Dispense Refill  . ALPRAZolam (XANAX) 0.25 MG tablet Take 1 tablet (0.25 mg total) by mouth 2 (two) times daily as needed for anxiety. 30 tablet 1  . atorvastatin (LIPITOR) 40 MG tablet Take 1 tablet (40 mg  total) by mouth daily. 90 tablet 3  . clonazePAM (KLONOPIN) 0.5 MG tablet Take 1 tablet (0.5 mg total) by mouth at bedtime. 30 tablet 3  . hydrochlorothiazide (HYDRODIURIL) 25 MG tablet Take 1 tablet (25 mg total) by mouth daily. 90 tablet 3  . ibuprofen (ADVIL,MOTRIN) 100 MG tablet Take 100 mg by mouth every 6 (six) hours as needed for fever.    Marland Kitchen omeprazole (PRILOSEC) 40 MG capsule Take 1 capsule (40 mg total) by mouth daily. 90 capsule 3   No current facility-administered medications on file prior to visit.    BP 130/80 mmHg  Pulse 77  Temp(Src) 98 F (36.7 C) (Oral)  Ht 5\' 4"  (1.626 m)  Wt 161 lb (73.029 kg)  BMI 27.62 kg/m2  SpO2 97%       Objective:   Physical Exam  General Mental Status- Alert. General Appearance- Not in acute distress.   Skin General: Color- Normal Color.  Moisture- Normal Moisture.  Neck Carotid Arteries- Normal color. Moisture- Normal Moisture. No carotid bruits. No JVD.  Chest and Lung Exam Auscultation: Breath Sounds:-Normal.  Cardiovascular Auscultation:Rythm- Regular. Murmurs & Other Heart Sounds:Auscultation of the heart reveals- No Murmurs.  Abdomen Inspection:-Inspeection Normal. Palpation/Percussion:Note:No mass. Palpation and Percussion of the abdomen reveal- only faint epigastric  Tender, Non Distended + BS, no rebound or guarding.    Neurologic Cranial Nerve exam:- CN III-XII intact(No nystagmus), symmetric smile. Strength:- 5/5 equal and symmetric strength both upper and lower extremities.  Lt shoulder- good rom, no pain on palpation.      Assessment & Plan:  Ekg- reviewed today and compared to prior. Only nonspecific st.  With your hx of gerd and recent pain after eating will give gi cocktail today.  Your only have very faint symptoms in stomach now(found on exam). And no shoulder pain. But do want to do ekg today in office and send out stat troponin since you report pain in stomach and shoulder daily since past Thursday up until yesterday.  Also will get cbc, cmp, amylase and lipase. Start ranitidine. Rx given today.  We will call you on stat lab results.   If your symptoms worsen again despite out treatment. Particularly abdomen pain with lt shoulder pain then ED evaluation in [place] close to your house may be the best option due to travel distance here greater than 30 minutes. Please update me tomorrow on how you are feeling.  With the intermittent associated shoulder pain I may refer you to cardiology.  Follow up 7 days prp or as needed

## 2015-09-27 NOTE — Patient Instructions (Addendum)
With your hx of gerd and recent pain after eating will give gi cocktail today.  Your only have faint symptoms in stomach now(found on exam). And no shoulder pain. But do want to do ekg today in office and send out stat troponin since you report pain in stomach and shoulder daily since past Thursday up until yesterday.  Also will get cbc, cmp, amylase and lipase. Start ranitidine. Rx given today.  We will call you on stat lab results.   If your symptoms worsen again despite out treatment. Particularly abdomen pain with lt shoulder pain then ED evaluation in [place] close to your house may be the best option due to travel distance here greater than 30 minutes. Please update me tomorrow on how you are feeling.  With the intermittent associated shoulder pain I may refer you to cardiology.(depends how you respond to tx). May refer to gi as well.  Follow up 7 days prp or as needed

## 2015-10-04 ENCOUNTER — Ambulatory Visit (INDEPENDENT_AMBULATORY_CARE_PROVIDER_SITE_OTHER): Payer: Medicare Other | Admitting: Medical

## 2015-10-04 ENCOUNTER — Encounter: Payer: Self-pay | Admitting: Medical

## 2015-10-04 ENCOUNTER — Ambulatory Visit: Payer: Medicare Other | Admitting: Family Medicine

## 2015-10-04 VITALS — BP 140/80 | HR 69 | Temp 97.5°F | Ht 64.0 in | Wt 163.2 lb

## 2015-10-04 DIAGNOSIS — K2100 Gastro-esophageal reflux disease with esophagitis, without bleeding: Secondary | ICD-10-CM

## 2015-10-04 MED ORDER — RANITIDINE HCL 150 MG PO CAPS: 150.0000 mg | ORAL_CAPSULE | Freq: Two times a day (BID) | ORAL | Status: DC

## 2015-10-04 NOTE — Progress Notes (Signed)
Subjective:    Patient ID: Isabella Andersen, female    DOB: 13-Feb-1941, 74 y.o.   MRN: [id number]  HPI   Pt states for follow up on last visit.  I rx'd ranitidine after taking GI cocktail. She feels a lot better. By that Saturday she became asymptomatic.   Pt states eating now and feels good.Pt is no longer using her prilosec. Although I had intended to give ranitidine as an add on.  Pt got lost on the way here and therefore states she was very stressed that caused her bp to increase.   Review of Systems  Constitutional: Negative for fever, chills, diaphoresis, activity change and fatigue.  Respiratory: Negative for cough, chest tightness and shortness of breath.   Cardiovascular: Negative for chest pain, palpitations and leg swelling.  Gastrointestinal: Negative for nausea, vomiting and abdominal pain.  Musculoskeletal: Negative for neck pain and neck stiffness.  Neurological: Negative for dizziness, tremors, seizures, syncope, facial asymmetry, speech difficulty, weakness, light-headedness, numbness and headaches.  Psychiatric/Behavioral: Negative for behavioral problems, confusion and agitation. The patient is not nervous/anxious.     Past Medical History  Diagnosis Date  . GERD (gastroesophageal reflux disease)   . Hyperlipidemia   . Hypertension   . Osteoporosis   . Cataract   . Heart murmur   . Shingles     Social History   Social History  . Marital Status: Married    Spouse Name: N/A  . Number of Children: N/A  . Years of Education: N/A   Occupational History  . Not on file.   Social History Main Topics  . Smoking status: Never Smoker   . Smokeless tobacco: Never Used  . Alcohol Use: No  . Drug Use: No  . Sexual Activity: Not on file   Other Topics Concern  . Not on file   Social History Narrative    Past Surgical History  Procedure Laterality Date  . Foot surgery    . Appendectomy    . Tonsillectomy and adenoidectomy    . Abdominal hysterectomy   1972  . Cataract extraction      both eyes  . Colonoscopy    . Blepharoplasty  2015    Family History  Problem Relation Age of Onset  . Colon cancer Mother     died of colon ca  . Colon polyps Sister   . Esophageal cancer Neg Hx   . Rectal cancer Neg Hx   . Stomach cancer Neg Hx   . Colon polyps Sister   . Heart disease Father     died of heart attack  . Heart disease Maternal Grandmother   . Heart disease Maternal Grandfather     No Known Allergies  Current Outpatient Prescriptions on File Prior to Visit  Medication Sig Dispense Refill  . ALPRAZolam (XANAX) 0.25 MG tablet Take 1 tablet (0.25 mg total) by mouth 2 (two) times daily as needed for anxiety. 30 tablet 1  . atorvastatin (LIPITOR) 40 MG tablet Take 1 tablet (40 mg total) by mouth daily. 90 tablet 3  . clonazePAM (KLONOPIN) 0.5 MG tablet Take 1 tablet (0.5 mg total) by mouth at bedtime. 30 tablet 3  . hydrochlorothiazide (HYDRODIURIL) 25 MG tablet Take 1 tablet (25 mg total) by mouth daily. 90 tablet 3  . ibuprofen (ADVIL,MOTRIN) 100 MG tablet Take 100 mg by mouth every 6 (six) hours as needed for fever.    . ranitidine (ZANTAC) 150 MG capsule Take 1 capsule (150 mg total)  by mouth 2 (two) times daily. 60 capsule 0  . omeprazole (PRILOSEC) 40 MG capsule Take 1 capsule (40 mg total) by mouth daily. 90 capsule 3   No current facility-administered medications on file prior to visit.    BP 141/87 mmHg  Pulse 69  Temp(Src) 97.5 F (36.4 C) (Oral)  Ht 5\' 4"  (1.626 m)  Wt 163 lb 3.2 oz (74.027 kg)  BMI 28.00 kg/m2  SpO2 98%       Objective:   Physical Exam  General Appearance- Not in acute distress.  HEENT Eyes- Scleraeral/Conjuntiva-bilat- Not Yellow. Mouth & Throat- Normal.  Chest and Lung Exam Auscultation: Breath sounds:-Normal. Adventitious sounds:- No Adventitious sounds.  Cardiovascular Auscultation:Rythm - Regular. Heart Sounds -Normal heart sounds.  Abdomen Inspection:-Inspection Normal.    Palpation/Perucssion: Palpation and Percussion of the abdomen reveal- Non Tender, No Rebound tenderness, No rigidity(Guarding) and No Palpable abdominal masses.  Liver:-Normal.  Spleen:- Normal.     Neurologic Cranial Nerve exam:- CN III-XII intact(No nystagmus), symmetric smile. Strength:- 5/5 equal and symmetric strength both upper and lower extremities.        Assessment & Plan:  Pt doing very well with the ranitidine. Will give her another refill of ranitidine. Will advise continue omeprazole as well. Then in one month stop ranitidine and see if back to her former baseline.   Your recheck of bp when I checked was 140/80.  Follow up 2-3 months with Dr. Charlett Blake for your physical or as needed.

## 2015-10-04 NOTE — Patient Instructions (Addendum)
Pt doing very well with the ranitidine. Will give her another refill of ranitidine. Will advise continue omeprazole as well. Then in one month stop ranitidine and see if back to her former baseline.   Your recheck of bp when I checked was 140/80.  Follow 4  months with Dr. Charlett Blake for your physical or as needed.

## 2015-10-04 NOTE — Progress Notes (Signed)
Pre visit review using our clinic review tool, if applicable. No additional management support is needed unless otherwise documented below in the visit note. 

## 2015-10-22 DIAGNOSIS — Z23 Encounter for immunization: Secondary | ICD-10-CM | POA: Diagnosis not present

## 2015-11-14 DIAGNOSIS — L723 Sebaceous cyst: Secondary | ICD-10-CM | POA: Diagnosis not present

## 2015-11-14 DIAGNOSIS — L0390 Cellulitis, unspecified: Secondary | ICD-10-CM | POA: Diagnosis not present

## 2015-12-17 ENCOUNTER — Encounter: Payer: Self-pay | Admitting: Gastroenterology

## 2016-01-23 ENCOUNTER — Encounter: Payer: Self-pay | Admitting: Behavioral Health

## 2016-01-23 ENCOUNTER — Telehealth: Payer: Self-pay | Admitting: Behavioral Health

## 2016-01-23 NOTE — Telephone Encounter (Signed)
Pre-Visit Call completed with patient and chart updated.   Pre-Visit Info documented in Specialty Comments under [employer].    

## 2016-01-24 ENCOUNTER — Other Ambulatory Visit: Payer: Self-pay | Admitting: Family Medicine

## 2016-01-24 ENCOUNTER — Encounter: Payer: Self-pay | Admitting: Family Medicine

## 2016-01-24 ENCOUNTER — Ambulatory Visit (HOSPITAL_BASED_OUTPATIENT_CLINIC_OR_DEPARTMENT_OTHER)
Admission: RE | Admit: 2016-01-24 | Discharge: 2016-01-24 | Disposition: A | Discharge status: Home / Self Care | Payer: Medicare Other | Source: Ambulatory Visit | Attending: Family Medicine | Admitting: Family Medicine

## 2016-01-24 ENCOUNTER — Ambulatory Visit (INDEPENDENT_AMBULATORY_CARE_PROVIDER_SITE_OTHER): Payer: Medicare Other | Admitting: Family Medicine

## 2016-01-24 VITALS — BP 130/84 | HR 88 | Temp 98.0°F | Ht 64.0 in | Wt 165.2 lb

## 2016-01-24 DIAGNOSIS — Z1231 Encounter for screening mammogram for malignant neoplasm of breast: Secondary | ICD-10-CM | POA: Diagnosis not present

## 2016-01-24 DIAGNOSIS — F418 Other specified anxiety disorders: Secondary | ICD-10-CM

## 2016-01-24 DIAGNOSIS — F32A Depression, unspecified: Secondary | ICD-10-CM

## 2016-01-24 DIAGNOSIS — I10 Essential (primary) hypertension: Secondary | ICD-10-CM | POA: Diagnosis not present

## 2016-01-24 DIAGNOSIS — Z1239 Encounter for other screening for malignant neoplasm of breast: Secondary | ICD-10-CM

## 2016-01-24 DIAGNOSIS — E785 Hyperlipidemia, unspecified: Secondary | ICD-10-CM

## 2016-01-24 DIAGNOSIS — M810 Age-related osteoporosis without current pathological fracture: Secondary | ICD-10-CM

## 2016-01-24 DIAGNOSIS — F419 Anxiety disorder, unspecified: Secondary | ICD-10-CM | POA: Insufficient documentation

## 2016-01-24 DIAGNOSIS — E559 Vitamin D deficiency, unspecified: Secondary | ICD-10-CM

## 2016-01-24 DIAGNOSIS — R1013 Epigastric pain: Secondary | ICD-10-CM

## 2016-01-24 DIAGNOSIS — Z0000 Encounter for general adult medical examination without abnormal findings: Secondary | ICD-10-CM

## 2016-01-24 DIAGNOSIS — Z8619 Personal history of other infectious and parasitic diseases: Secondary | ICD-10-CM | POA: Insufficient documentation

## 2016-01-24 DIAGNOSIS — K219 Gastro-esophageal reflux disease without esophagitis: Secondary | ICD-10-CM

## 2016-01-24 DIAGNOSIS — F329 Major depressive disorder, single episode, unspecified: Secondary | ICD-10-CM

## 2016-01-24 DIAGNOSIS — Z23 Encounter for immunization: Secondary | ICD-10-CM | POA: Diagnosis not present

## 2016-01-24 DIAGNOSIS — Z86010 Personal history of colonic polyps: Secondary | ICD-10-CM

## 2016-01-24 LAB — LIPID PANEL
Cholesterol: 246 mg/dL — ABNORMAL HIGH (ref 0–200)
HDL: 74.5 mg/dL (ref >39.00)
LDL Cholesterol: 151 mg/dL — ABNORMAL HIGH (ref 0–99)
NONHDL: 171.05
TRIGLYCERIDES: 100 mg/dL (ref 0.0–149.0)
Total CHOL/HDL Ratio: 3
VLDL: 20 mg/dL (ref 0.0–40.0)

## 2016-01-24 LAB — CBC
HCT: 36.8 % (ref 36.0–46.0)
Hemoglobin: 12.1 g/dL (ref 12.0–15.0)
MCHC: 33 g/dL (ref 30.0–36.0)
MCV: 81 fl (ref 78.0–100.0)
PLATELETS: 282 10*3/uL (ref 150.0–400.0)
RBC: 4.55 Mil/uL (ref 3.87–5.11)
RDW: 14.9 % (ref 11.5–15.5)
WBC: 5.6 10*3/uL (ref 4.0–10.5)

## 2016-01-24 LAB — COMPREHENSIVE METABOLIC PANEL
ALK PHOS: 94 U/L (ref 39–117)
ALT: 22 U/L (ref 0–35)
AST: 25 U/L (ref 0–37)
Albumin: 4.6 g/dL (ref 3.5–5.2)
BILIRUBIN TOTAL: 0.5 mg/dL (ref 0.2–1.2)
BUN: 13 mg/dL (ref 6–23)
CALCIUM: 9.6 mg/dL (ref 8.4–10.5)
CO2: 29 mEq/L (ref 19–32)
Chloride: 106 mEq/L (ref 96–112)
Creatinine, Ser: 0.75 mg/dL (ref 0.40–1.20)
GFR: 80.59 mL/min (ref >60.00)
GLUCOSE: 107 mg/dL — AB (ref 70–99)
POTASSIUM: 4.4 meq/L (ref 3.5–5.1)
Sodium: 141 mEq/L (ref 135–145)
TOTAL PROTEIN: 7.1 g/dL (ref 6.0–8.3)

## 2016-01-24 LAB — VITAMIN D 25 HYDROXY (VIT D DEFICIENCY, FRACTURES): VITD: 17.48 ng/mL — AB (ref 30.00–100.00)

## 2016-01-24 LAB — TSH: TSH: 1.85 u[IU]/mL (ref 0.35–4.50)

## 2016-01-24 LAB — H. PYLORI ANTIBODY, IGG: H PYLORI IGG: NEGATIVE

## 2016-01-24 MED ORDER — HYDROCHLOROTHIAZIDE 25 MG PO TABS: 25.0000 mg | ORAL_TABLET | Freq: Every day | ORAL | Status: DC

## 2016-01-24 MED ORDER — ATORVASTATIN CALCIUM 40 MG PO TABS: 40.0000 mg | ORAL_TABLET | Freq: Every day | ORAL | Status: DC

## 2016-01-24 MED ORDER — ZOSTER VACCINE LIVE 19400 UNT/0.65ML ~~LOC~~ SOLR: 0.6500 mL | Freq: Once | SUBCUTANEOUS | Status: DC

## 2016-01-24 MED ORDER — ALPRAZOLAM 0.25 MG PO TABS: 0.2500 mg | ORAL_TABLET | Freq: Two times a day (BID) | ORAL | Status: DC | PRN

## 2016-01-24 MED ORDER — VITAMIN D (ERGOCALCIFEROL) 1.25 MG (50000 UNIT) PO CAPS: 50000.0000 [IU] | ORAL_CAPSULE | ORAL | Status: DC

## 2016-01-24 NOTE — Progress Notes (Signed)
Pre visit review using our clinic review tool, if applicable. No additional management support is needed unless otherwise documented below in the visit note. 

## 2016-01-24 NOTE — Patient Instructions (Signed)
[employer] makes a probiotic that has 10 strain probiotic 1 cap daily.  Alternate Ranitidine and Omeprazole every other day for 1 month, can increase both to daily if partial relief Food Choices for Gastroesophageal Reflux Disease, Adult When you have gastroesophageal reflux disease (GERD), the foods you eat and your eating habits are very important. Choosing the right foods can help ease your discomfort.  WHAT GUIDELINES DO I NEED TO FOLLOW?   Choose fruits, vegetables, whole grains, and low-fat dairy products.   Choose low-fat meat, fish, and poultry.  Limit fats such as oils, salad dressings, butter, nuts, and avocado.   Keep a food diary. This helps you identify foods that cause symptoms.   Avoid foods that cause symptoms. These may be different for everyone.   Eat small meals often instead of 3 large meals a day.   Eat your meals slowly, in a place where you are relaxed.   Limit fried foods.   Cook foods using methods other than frying.   Avoid drinking alcohol.   Avoid drinking large amounts of liquids with your meals.   Avoid bending over or lying down until 2-3 hours after eating.  WHAT FOODS ARE NOT RECOMMENDED?  These are some foods and drinks that may make your symptoms worse: Vegetables Tomatoes. Tomato juice. Tomato and spaghetti sauce. Chili peppers. Onion and garlic. Horseradish. Fruits Oranges, grapefruit, and lemon (fruit and juice). Meats High-fat meats, fish, and poultry. This includes hot dogs, ribs, ham, sausage, salami, and bacon. Dairy Whole milk and chocolate milk. Sour cream. Cream. Butter. Ice cream. Cream cheese.  Drinks Coffee and tea. Bubbly (carbonated) drinks or energy drinks. Condiments Hot sauce. Barbecue sauce.  Sweets/Desserts Chocolate and cocoa. Donuts. Peppermint and spearmint. Fats and Oils High-fat foods. This includes [place] fries and potato chips. Other Vinegar. Strong spices. This includes black pepper, white  pepper, red pepper, cayenne, curry powder, cloves, ginger, and chili powder. The items listed above may not be a complete list of foods and drinks to avoid. Contact your dietitian for more information.   This information is not intended to replace advice given to you by your health care provider. Make sure you discuss any questions you have with your health care provider.   Document Released: 05/11/2012 Document Revised: 12/01/2014 Document Reviewed: 09/14/2013 Elsevier Interactive Patient Education [employer].

## 2016-01-24 NOTE — Assessment & Plan Note (Signed)
Avoid offending foods, start probiotics. Do not eat large meals in late evening and consider raising head of bed. Has symptoms day and night, with sour taste in mouth. Takes Omeprazole only prn. Ranitidine is also prn

## 2016-02-03 ENCOUNTER — Encounter: Payer: Self-pay | Admitting: Family Medicine

## 2016-02-03 DIAGNOSIS — R1013 Epigastric pain: Secondary | ICD-10-CM | POA: Insufficient documentation

## 2016-02-03 DIAGNOSIS — E559 Vitamin D deficiency, unspecified: Secondary | ICD-10-CM

## 2016-02-03 HISTORY — DX: Vitamin D deficiency, unspecified: E55.9

## 2016-02-03 NOTE — Assessment & Plan Note (Signed)
Labs reveal deficiency. Start on Vitamin D 50000 IU caps, 1 cap po weekly x 12 weeks. Disp #4 with 4 rf. Also take daily Vitamin D over the counter. If already taking a daily supplement increase by 1000 IU daily and if not start Vitamin D 2000 IU daily.  

## 2016-02-03 NOTE — Assessment & Plan Note (Signed)
Patient denies any difficulties at home. No trouble with ADLs, depression or falls. See EMR for functional status screen and depression screen. No recent changes to vision or hearing. Is UTD with immunizations. Is UTD with screening. Discussed Advanced Directives. Encouraged heart healthy diet, exercise as tolerated and adequate sleep. See patient's problem list for health risk factors to monitor. See AVS for preventative healthcare recommendation schedule. Immunization Status: Flu vaccine-- 10/25/15; patient reported that it was given at [facility] Tdap-- 11/25/07 PNA-- 12/22/14 Shingles-- patient reported that she has not received this vaccine.  A/P:  Changes to FH, PSH or Personal Hx: UTD Pap-- past history of hysterectomy; patient no longer have pap smears completed due to age. MMG-- 05/23/14 w/ the [facility]; bi-rads category 1-negative Bone Density-- 01/03/15 w/ [place] [facility]; AP Spine L1-L4 (L3) T-score -2.4 CCS-- 12/23/12 w/ Dr. Norberto Sorenson T. [facility] Plan at [facility]; sessile polyp (6 mm) in the ascending colon; polypectomy performed; sessile polyp (4 mm) in the transverse colon; polypectomy performed; semi-pedunculated polyp (8 mm) in the sigmoid colon; polypectomy performed; follow-up in 3 years.  Care Teams Updated: Dr. Druscilla Brownie - Dermatology

## 2016-02-03 NOTE — Progress Notes (Signed)
Subjective:    Patient ID: Isabella Andersen, female    DOB: 08-24-1941, 75 y.o.   MRN: [id number]  Chief Complaint  Patient presents with  . Annual Exam    HPI Patient is in today for wellness visit and to establish care with this provider. See PMH in EMR  She feels well today. No recent illness no recent hospitalization. Is doing well with ADLs, walks everyday. Does admit to some recent increased anxiety without any obvious trigger. Denies CP/palp/SOB/HA/congestion/fevers/GI or GU c/o. Taking meds as prescribed Past Medical History  Diagnosis Date  . GERD (gastroesophageal reflux disease)   . Hyperlipidemia   . Hypertension   . Osteoporosis   . Cataract   . Heart murmur   . Shingles   . History of chicken pox   . H/O measles   . H/O mumps   . Anxiety   . Vitamin D deficiency 02/03/2016    Past Surgical History  Procedure Laterality Date  . Foot surgery    . Appendectomy    . Tonsillectomy and adenoidectomy    . Abdominal hysterectomy  1972  . Cataract extraction      both eyes  . Colonoscopy    . Blepharoplasty  2015  . Eye surgery Bilateral 2013    b/l cataracts, By Dr Dolores Lory at [facility] History  Problem Relation Age of Onset  . Colon cancer Mother     died of colon ca  . Cancer Mother     breast, colon cancer  . Colon polyps Sister   . Esophageal cancer Neg Hx   . Rectal cancer Neg Hx   . Stomach cancer Neg Hx   . Colon polyps Sister   . Heart disease Father     died of heart attack  . Heart disease Maternal Grandmother   . Heart disease Maternal Grandfather   . Cancer Maternal Aunt     breast  . Asthma Paternal Uncle   . Cancer Maternal Aunt     breast  . Heart disease Brother     MI    Social History   Social History  . Marital Status: Married    Spouse Name: N/A  . Number of Children: N/A  . Years of Education: N/A   Occupational History  . Not on file.   Social History Main Topics  . Smoking status: Never Smoker   . Smokeless  tobacco: Never Used  . Alcohol Use: No  . Drug Use: No  . Sexual Activity: Not on file     Comment: lives with husband, works as Network engineer, no dietary restrictions   Other Topics Concern  . Not on file   Social History Narrative    Outpatient Prescriptions Prior to Visit  Medication Sig Dispense Refill  . ranitidine (ZANTAC) 150 MG capsule Take 1 capsule (150 mg total) by mouth 2 (two) times daily. 60 capsule 0  . ALPRAZolam (XANAX) 0.25 MG tablet Take 1 tablet (0.25 mg total) by mouth 2 (two) times daily as needed for anxiety. 30 tablet 1  . atorvastatin (LIPITOR) 40 MG tablet Take 1 tablet (40 mg total) by mouth daily. 90 tablet 3  . clonazePAM (KLONOPIN) 0.5 MG tablet Take 1 tablet (0.5 mg total) by mouth at bedtime. 30 tablet 3  . hydrochlorothiazide (HYDRODIURIL) 25 MG tablet Take 1 tablet (25 mg total) by mouth daily. 90 tablet 3  . omeprazole (PRILOSEC) 40 MG capsule Take 1 capsule (40 mg total) by  mouth daily. 90 capsule 3   No facility-administered medications prior to visit.    No Known Allergies  Review of Systems  Constitutional: Negative for fever, chills and malaise/fatigue.  HENT: Negative for congestion and hearing loss.   Eyes: Negative for discharge.  Respiratory: Negative for cough, sputum production and shortness of breath.   Cardiovascular: Negative for chest pain, palpitations and leg swelling.  Gastrointestinal: Negative for heartburn, nausea, vomiting, abdominal pain, diarrhea, constipation and blood in stool.  Genitourinary: Negative for dysuria, urgency, frequency and hematuria.  Musculoskeletal: Negative for myalgias, back pain and falls.  Skin: Negative for rash.  Neurological: Negative for dizziness, sensory change, loss of consciousness, weakness and headaches.  Endo/Heme/Allergies: Negative for environmental allergies. Does not bruise/bleed easily.  Psychiatric/Behavioral: Negative for depression and suicidal ideas. The patient is nervous/anxious.  The patient does not have insomnia.        Objective:    Physical Exam  Constitutional: She is oriented to person, place, and time. She appears well-developed and well-nourished. No distress.  HENT:  Head: Normocephalic and atraumatic.  Eyes: Conjunctivae are normal.  Neck: Neck supple. No thyromegaly present.  Cardiovascular: Normal rate, regular rhythm and normal heart sounds.   No murmur heard. Pulmonary/Chest: Effort normal and breath sounds normal. No respiratory distress.  Abdominal: Soft. Bowel sounds are normal. She exhibits no distension and no mass. There is no tenderness.  Musculoskeletal: She exhibits no edema.  Lymphadenopathy:    She has no cervical adenopathy.  Neurological: She is alert and oriented to person, place, and time.  Skin: Skin is warm and dry.  Psychiatric: She has a normal mood and affect. Her behavior is normal.    BP 130/84 mmHg  Pulse 88  Temp(Src) 98 F (36.7 C) (Oral)  Ht 5\' 4"  (1.626 m)  Wt 165 lb 4 oz (74.957 kg)  BMI 28.35 kg/m2  SpO2 99% Wt Readings from Last 3 Encounters:  01/24/16 165 lb 4 oz (74.957 kg)  10/04/15 163 lb 3.2 oz (74.027 kg)  09/27/15 161 lb (73.029 kg)     Lab Results  Component Value Date   WBC 5.6 01/24/2016   HGB 12.1 01/24/2016   HCT 36.8 01/24/2016   PLT 282.0 01/24/2016   GLUCOSE 107* 01/24/2016   CHOL 246* 01/24/2016   TRIG 100.0 01/24/2016   HDL 74.50 01/24/2016   LDLDIRECT 120.2 07/14/2012   LDLCALC 151* 01/24/2016   ALT 22 01/24/2016   AST 25 01/24/2016   NA 141 01/24/2016   K 4.4 01/24/2016   CL 106 01/24/2016   CREATININE 0.75 01/24/2016   BUN 13 01/24/2016   CO2 29 01/24/2016   TSH 1.85 01/24/2016    Lab Results  Component Value Date   TSH 1.85 01/24/2016   Lab Results  Component Value Date   WBC 5.6 01/24/2016   HGB 12.1 01/24/2016   HCT 36.8 01/24/2016   MCV 81.0 01/24/2016   PLT 282.0 01/24/2016   Lab Results  Component Value Date   NA 141 01/24/2016   K 4.4 01/24/2016     CO2 29 01/24/2016   GLUCOSE 107* 01/24/2016   BUN 13 01/24/2016   CREATININE 0.75 01/24/2016   BILITOT 0.5 01/24/2016   ALKPHOS 94 01/24/2016   AST 25 01/24/2016   ALT 22 01/24/2016   PROT 7.1 01/24/2016   ALBUMIN 4.6 01/24/2016   CALCIUM 9.6 01/24/2016   GFR 80.59 01/24/2016   Lab Results  Component Value Date   CHOL 246* 01/24/2016   Lab  Results  Component Value Date   HDL 74.50 01/24/2016   Lab Results  Component Value Date   LDLCALC 151* 01/24/2016   Lab Results  Component Value Date   TRIG 100.0 01/24/2016   Lab Results  Component Value Date   CHOLHDL 3 01/24/2016   No results found for: HGBA1C     Assessment & Plan:   Problem List Items Addressed This Visit    Anxiety and depression - Primary   Relevant Medications   ALPRAZolam (XANAX) 0.25 MG tablet   atorvastatin (LIPITOR) 40 MG tablet   hydrochlorothiazide (HYDRODIURIL) 25 MG tablet   Other Relevant Orders   Lipid panel (Completed)   Comprehensive metabolic panel (Completed)   TSH (Completed)   CBC (Completed)   VITAMIN D 25 Hydroxy (Vit-D Deficiency, Fractures) (Completed)   Dyspepsia   Relevant Orders   H. pylori antibody, IgG (Completed)   GERD (gastroesophageal reflux disease)    Avoid offending foods, start probiotics. Do not eat large meals in late evening and consider raising head of bed. Has symptoms day and night, with sour taste in mouth. Takes Omeprazole only prn. Ranitidine is also prn      Relevant Medications   atorvastatin (LIPITOR) 40 MG tablet   hydrochlorothiazide (HYDRODIURIL) 25 MG tablet   Other Relevant Orders   Lipid panel (Completed)   Comprehensive metabolic panel (Completed)   TSH (Completed)   CBC (Completed)   VITAMIN D 25 Hydroxy (Vit-D Deficiency, Fractures) (Completed)   H. pylori antibody, IgG (Completed)   Hyperlipidemia    Tolerating statin, encouraged heart healthy diet, avoid trans fats, minimize simple carbs and saturated fats. Increase exercise as  tolerated      Relevant Medications   atorvastatin (LIPITOR) 40 MG tablet   hydrochlorothiazide (HYDRODIURIL) 25 MG tablet   Other Relevant Orders   Lipid panel (Completed)   Comprehensive metabolic panel (Completed)   TSH (Completed)   CBC (Completed)   VITAMIN D 25 Hydroxy (Vit-D Deficiency, Fractures) (Completed)   Hypertension    Well controlled, no changes to meds. Encouraged heart healthy diet such as the DASH diet and exercise as tolerated.       Relevant Medications   atorvastatin (LIPITOR) 40 MG tablet   hydrochlorothiazide (HYDRODIURIL) 25 MG tablet   Other Relevant Orders   Lipid panel (Completed)   Comprehensive metabolic panel (Completed)   TSH (Completed)   CBC (Completed)   VITAMIN D 25 Hydroxy (Vit-D Deficiency, Fractures) (Completed)   Medicare annual wellness visit, subsequent    Patient denies any difficulties at home. No trouble with ADLs, depression or falls. See EMR for functional status screen and depression screen. No recent changes to vision or hearing. Is UTD with immunizations. Is UTD with screening. Discussed Advanced Directives. Encouraged heart healthy diet, exercise as tolerated and adequate sleep. See patient's problem list for health risk factors to monitor. See AVS for preventative healthcare recommendation schedule. Immunization Status: Flu vaccine-- 10/25/15; patient reported that it was [facility] Tdap-- 11/25/07 PNA-- 12/22/14 Shingles-- patient reported that she has not received this vaccine.  A/P:  Changes to FH, PSH or Personal Hx: UTD Pap-- past history of hysterectomy; patient no longer have pap smears completed due to age. MMG-- 05/23/14 w/ the [place]; bi-rads category 1-negative Bone Density-- 01/03/15 w/ [place] at [facility]; AP Spine L1-L4 (L3) T-score -2.4 CCS-- 12/23/12 w/ Dr. Norberto Sorenson T. [facility] Plan at [facility]; sessile polyp (6 mm) in the ascending colon; polypectomy performed; sessile  polyp (4 mm) in the transverse colon; polypectomy performed; semi-pedunculated polyp (8 mm) in the sigmoid colon; polypectomy performed; follow-up in 3 years.  Care Teams Updated: Dr. Druscilla Brownie - Dermatology      Osteoporosis   Relevant Medications   atorvastatin (LIPITOR) 40 MG tablet   hydrochlorothiazide (HYDRODIURIL) 25 MG tablet   Other Relevant Orders   Lipid panel (Completed)   Comprehensive metabolic panel (Completed)   TSH (Completed)   CBC (Completed)   VITAMIN D 25 Hydroxy (Vit-D Deficiency, Fractures) (Completed)   Vitamin D deficiency    Labs reveal deficiency. Start on Vitamin D 50000 IU caps, 1 cap po weekly x 12 weeks. Disp #4 with 4 rf. Also take daily Vitamin D over the counter. If already taking a daily supplement increase by 1000 IU daily and if not start Vitamin D 2000 IU daily.        Other Visit Diagnoses    Breast cancer screening        Relevant Orders    MM Digital Screening (Completed)    History of colonic polyps        Relevant Orders    Ambulatory referral to Gastroenterology    Need for 23-polyvalent pneumococcal polysaccharide vaccine        Relevant Orders    Pneumococcal polysaccharide vaccine 23-valent greater than or equal to 2yo subcutaneous/IM (Completed)       I have discontinued Ms. Bunton's clonazePAM. I have also changed her ALPRAZolam. Additionally, I am having her start on zoster vaccine live (PF). Lastly, I am having her maintain her omeprazole, ranitidine, atorvastatin, and hydrochlorothiazide.  Meds ordered this encounter  Medications  . ALPRAZolam (XANAX) 0.25 MG tablet    Sig: Take 1 tablet (0.25 mg total) by mouth 2 (two) times daily as needed for anxiety or sleep.    Dispense:  30 tablet    Refill:  2  . atorvastatin (LIPITOR) 40 MG tablet    Sig: Take 1 tablet (40 mg total) by mouth daily.    Dispense:  90 tablet    Refill:  3  . hydrochlorothiazide (HYDRODIURIL) 25 MG tablet    Sig: Take 1 tablet (25 mg total)  by mouth daily.    Dispense:  90 tablet    Refill:  3  . zoster vaccine live, PF, (ZOSTAVAX) 13086 UNT/0.65ML injection    Sig: Inject 19,400 Units into the skin once.    Dispense:  1 each    Refill:  0     [name], MD

## 2016-02-03 NOTE — Assessment & Plan Note (Signed)
Tolerating statin, encouraged heart healthy diet, avoid trans fats, minimize simple carbs and saturated fats. Increase exercise as tolerated 

## 2016-02-03 NOTE — Assessment & Plan Note (Signed)
Well controlled, no changes to meds. Encouraged heart healthy diet such as the DASH diet and exercise as tolerated.  °

## 2016-02-04 ENCOUNTER — Encounter: Payer: Self-pay | Admitting: Gastroenterology

## 2016-03-13 ENCOUNTER — Ambulatory Visit (AMBULATORY_SURGERY_CENTER): Payer: Self-pay | Admitting: *Deleted

## 2016-03-13 VITALS — Ht 64.0 in | Wt 165.0 lb

## 2016-03-13 DIAGNOSIS — Z86010 Personal history of colonic polyps: Secondary | ICD-10-CM

## 2016-03-13 MED ORDER — NA SULFATE-K SULFATE-MG SULF 17.5-3.13-1.6 GM/177ML PO SOLN: 1.0000 | Freq: Once | ORAL | Status: DC

## 2016-03-13 NOTE — Progress Notes (Signed)
No egg or soy allergy. No anesthesia problems.  No home O2.  No diet meds.  No emmi.  

## 2016-03-27 ENCOUNTER — Encounter: Payer: Self-pay | Admitting: Gastroenterology

## 2016-03-27 ENCOUNTER — Ambulatory Visit (AMBULATORY_SURGERY_CENTER): Payer: Medicare Other | Admitting: Gastroenterology

## 2016-03-27 VITALS — BP 138/62 | HR 68 | Temp 98.6°F | Resp 11 | Ht 64.0 in | Wt 165.0 lb

## 2016-03-27 DIAGNOSIS — K219 Gastro-esophageal reflux disease without esophagitis: Secondary | ICD-10-CM | POA: Diagnosis not present

## 2016-03-27 DIAGNOSIS — Z800 Family history of malignant neoplasm of digestive organs: Secondary | ICD-10-CM

## 2016-03-27 DIAGNOSIS — F419 Anxiety disorder, unspecified: Secondary | ICD-10-CM | POA: Diagnosis not present

## 2016-03-27 DIAGNOSIS — I10 Essential (primary) hypertension: Secondary | ICD-10-CM | POA: Diagnosis not present

## 2016-03-27 DIAGNOSIS — Z86010 Personal history of colonic polyps: Secondary | ICD-10-CM | POA: Diagnosis not present

## 2016-03-27 MED ORDER — SODIUM CHLORIDE 0.9 % IV SOLN: 500.0000 mL | INTRAVENOUS | Status: DC

## 2016-03-27 NOTE — Patient Instructions (Signed)
YOU HAD AN ENDOSCOPIC PROCEDURE TODAY AT [facility]:   Refer to the procedure report that was given to you for any specific questions about what was found during the examination.  If the procedure report does not answer your questions, please call your gastroenterologist to clarify.  If you requested that your care partner not be given the details of your procedure findings, then the procedure report has been included in a sealed envelope for you to review at your convenience later.  YOU SHOULD EXPECT: Some feelings of bloating in the abdomen. Passage of more gas than usual.  Walking can help get rid of the air that was put into your GI tract during the procedure and reduce the bloating. If you had a lower endoscopy (such as a colonoscopy or flexible sigmoidoscopy) you may notice spotting of blood in your stool or on the toilet paper. If you underwent a bowel prep for your procedure, you may not have a normal bowel movement for a few days.  Please Note:  You might notice some irritation and congestion in your nose or some drainage.  This is from the oxygen used during your procedure.  There is no need for concern and it should clear up in a day or so.  SYMPTOMS TO REPORT IMMEDIATELY:   Following lower endoscopy (colonoscopy or flexible sigmoidoscopy):  Excessive amounts of blood in the stool  Significant tenderness or worsening of abdominal pains  Swelling of the abdomen that is new, acute  Fever of 100F or higher  For urgent or emergent issues, a gastroenterologist can be reached at any hour by calling [phone].   DIET: Your first meal following the procedure should be a small meal and then it is ok to progress to your normal diet. Heavy or fried foods are harder to digest and may make you feel nauseous or bloated.  Likewise, meals heavy in dairy and vegetables can increase bloating.  Drink plenty of fluids but you should avoid alcoholic beverages for 24  hours.  ACTIVITY:  You should plan to take it easy for the rest of today and you should NOT DRIVE or use heavy machinery until tomorrow (because of the sedation medicines used during the test).    FOLLOW UP: Our staff will call the number listed on your records the next business day following your procedure to check on you and address any questions or concerns that you may have regarding the information given to you following your procedure. If we do not reach you, we will leave a message.  However, if you are feeling well and you are not experiencing any problems, there is no need to return our call.  We will assume that you have returned to your regular daily activities without incident.  SIGNATURES/CONFIDENTIALITY: You and/or your care partner have signed paperwork which will be entered into your electronic medical record.  These signatures attest to the fact that that the information above on your After Visit Summary has been reviewed and is understood.  Full responsibility of the confidentiality of this discharge information lies with you and/or your care-partner.  Please continue your normal medications  Next colonoscopy- 5 years

## 2016-03-27 NOTE — Op Note (Signed)
[facility] Patient Name: Isabella Andersen Procedure Date: 03/27/2016 8:31 AM MRN: [id number] Endoscopist: Ladene Artist , MD Age: 75 Date of Birth: 1940/12/09 Gender: Female Procedure:                Colonoscopy Indications:              Screening in patient at increased risk: Colorectal                            cancer in mother 31 or older, Surveillance:                            Personal history of adenomatous polyps on last                            colonoscopy > 3 years ago Medicines:                Monitored Anesthesia Care Procedure:                Pre-Anesthesia Assessment:                           - Prior to the procedure, a History and Physical                            was performed, and patient medications and                            allergies were reviewed. The patient's tolerance of                            previous anesthesia was also reviewed. The risks                            and benefits of the procedure and the sedation                            options and risks were discussed with the patient.                            All questions were answered, and informed consent                            was obtained. Prior Anticoagulants: The patient has                            taken no previous anticoagulant or antiplatelet                            agents. ASA Grade Assessment: II - A patient with                            mild systemic disease. After reviewing the risks  and benefits, the patient was deemed in                            satisfactory condition to undergo the procedure.                           After obtaining informed consent, the colonoscope                            was passed under direct vision. Throughout the                            procedure, the patient's blood pressure, pulse, and                            oxygen saturations were monitored continuously. The   Model PCF-[id number]) scope was introduced                            through the anus and advanced to the the cecum,                            identified by appendiceal orifice and ileocecal                            valve. The ileocecal valve, appendiceal orifice,                            and rectum were photographed. The quality of the                            bowel preparation was excellent. The colonoscopy                            was performed without difficulty. The patient                            tolerated the procedure well. Scope In: 8:38:56 AM Scope Out: 8:50:53 AM Scope Withdrawal Time: 0 hours 8 minutes 27 seconds  Total Procedure Duration: 0 hours 11 minutes 57 seconds  Findings:                 The entire examined colon appeared normal on direct                            and retroflexion views. Complications:            No immediate complications. Estimated blood loss:                            None. Estimated Blood Loss:     Estimated blood loss: none. Impression:               - The entire examined colon is normal on direct and  retroflexion views. Recommendation:           - Repeat colonoscopy in 5 years for surveillance.                           - Patient has a contact number available for                            emergencies. The signs and symptoms of potential                            delayed complications were discussed with the                            patient. Return to normal activities tomorrow.                            Written discharge instructions were provided to the                            patient.                           - Resume previous diet.                           - Continue present medications. Ladene Artist, MD 03/27/2016 8:54:21 AM This report has been signed electronically.

## 2016-03-27 NOTE — Progress Notes (Signed)
Report to PACU, RN, vss, BBS= Clear.  

## 2016-03-28 ENCOUNTER — Telehealth: Payer: Self-pay | Admitting: *Deleted

## 2016-03-28 NOTE — Telephone Encounter (Signed)
  Follow up Call-  Call back number 03/27/2016  Post procedure Call Back phone  # [phone]  Permission to leave phone message No     Patient questions:  Do you have a fever, pain , or abdominal swelling? No. Pain Score  0 *  Have you tolerated food without any problems? Yes.    Have you been able to return to your normal activities? Yes.    Do you have any questions about your discharge instructions: Diet   No. Medications  No. Follow up visit  No.  Do you have questions or concerns about your Care? No.  Actions: * If pain score is 4 or above: No action needed, pain <4.

## 2016-08-28 ENCOUNTER — Ambulatory Visit (INDEPENDENT_AMBULATORY_CARE_PROVIDER_SITE_OTHER): Payer: Medicare Other | Admitting: Family Medicine

## 2016-08-28 DIAGNOSIS — Z23 Encounter for immunization: Secondary | ICD-10-CM | POA: Diagnosis not present

## 2016-08-28 DIAGNOSIS — E559 Vitamin D deficiency, unspecified: Secondary | ICD-10-CM

## 2016-08-28 DIAGNOSIS — E782 Mixed hyperlipidemia: Secondary | ICD-10-CM | POA: Diagnosis not present

## 2016-08-28 DIAGNOSIS — I10 Essential (primary) hypertension: Secondary | ICD-10-CM

## 2016-08-28 DIAGNOSIS — K219 Gastro-esophageal reflux disease without esophagitis: Secondary | ICD-10-CM

## 2016-08-28 DIAGNOSIS — M810 Age-related osteoporosis without current pathological fracture: Secondary | ICD-10-CM | POA: Diagnosis not present

## 2016-08-28 LAB — CBC
HCT: 38.9 % (ref 36.0–46.0)
Hemoglobin: 12.8 g/dL (ref 12.0–15.0)
MCHC: 33 g/dL (ref 30.0–36.0)
MCV: 79.7 fl (ref 78.0–100.0)
Platelets: 270 10*3/uL (ref 150.0–400.0)
RBC: 4.89 Mil/uL (ref 3.87–5.11)
RDW: 15.8 % — ABNORMAL HIGH (ref 11.5–15.5)
WBC: 4.9 10*3/uL (ref 4.0–10.5)

## 2016-08-28 LAB — COMPREHENSIVE METABOLIC PANEL
ALK PHOS: 113 U/L (ref 39–117)
ALT: 16 U/L (ref 0–35)
AST: 19 U/L (ref 0–37)
Albumin: 4.4 g/dL (ref 3.5–5.2)
BILIRUBIN TOTAL: 0.4 mg/dL (ref 0.2–1.2)
BUN: 7 mg/dL (ref 6–23)
CO2: 30 mEq/L (ref 19–32)
CREATININE: 0.73 mg/dL (ref 0.40–1.20)
Calcium: 9.7 mg/dL (ref 8.4–10.5)
Chloride: 102 mEq/L (ref 96–112)
GFR: 83.01 mL/min (ref >60.00)
GLUCOSE: 96 mg/dL (ref 70–99)
Potassium: 4.8 mEq/L (ref 3.5–5.1)
SODIUM: 140 meq/L (ref 135–145)
TOTAL PROTEIN: 7.4 g/dL (ref 6.0–8.3)

## 2016-08-28 LAB — LIPID PANEL
Cholesterol: 227 mg/dL — ABNORMAL HIGH (ref 0–200)
HDL: 67.7 mg/dL (ref >39.00)
LDL Cholesterol: 131 mg/dL — ABNORMAL HIGH (ref 0–99)
NONHDL: 159.15
Total CHOL/HDL Ratio: 3
Triglycerides: 141 mg/dL (ref 0.0–149.0)
VLDL: 28.2 mg/dL (ref 0.0–40.0)

## 2016-08-28 LAB — VITAMIN D 25 HYDROXY (VIT D DEFICIENCY, FRACTURES): VITD: 38.03 ng/mL (ref 30.00–100.00)

## 2016-08-28 LAB — TSH: TSH: 2.66 u[IU]/mL (ref 0.35–4.50)

## 2016-08-28 NOTE — Assessment & Plan Note (Signed)
Will check level today

## 2016-08-28 NOTE — Assessment & Plan Note (Signed)
Well controlled, no changes to meds. Encouraged heart healthy diet such as the DASH diet and exercise as tolerated.  °

## 2016-08-28 NOTE — Patient Instructions (Signed)

## 2016-08-28 NOTE — Assessment & Plan Note (Signed)
Tolerating statin, encouraged heart healthy diet, avoid trans fats, minimize simple carbs and saturated fats. Increase exercise as tolerated 

## 2016-08-28 NOTE — Progress Notes (Signed)
Pre visit review using our clinic review tool, if applicable. No additional management support is needed unless otherwise documented below in the visit note. 

## 2016-08-28 NOTE — Assessment & Plan Note (Addendum)
Avoid offending foods, take probiotics. Do not eat large meals in late evening and consider raising head of bed.  

## 2016-08-28 NOTE — Assessment & Plan Note (Signed)
Encouraged to get adequate exercise, calcium and vitamin d intake 

## 2016-08-29 ENCOUNTER — Other Ambulatory Visit: Payer: Self-pay | Admitting: Family Medicine

## 2016-08-29 DIAGNOSIS — F419 Anxiety disorder, unspecified: Principal | ICD-10-CM

## 2016-08-29 DIAGNOSIS — E782 Mixed hyperlipidemia: Secondary | ICD-10-CM

## 2016-08-29 DIAGNOSIS — K219 Gastro-esophageal reflux disease without esophagitis: Secondary | ICD-10-CM

## 2016-08-29 DIAGNOSIS — F329 Major depressive disorder, single episode, unspecified: Secondary | ICD-10-CM

## 2016-08-29 DIAGNOSIS — I10 Essential (primary) hypertension: Secondary | ICD-10-CM

## 2016-08-30 NOTE — Progress Notes (Signed)
Patient ID: Isabella Andersen, female   DOB: 08/23/1941, 75 y.o.   MRN: [id number]   Subjective:    Patient ID: Isabella Andersen, female    DOB: 09-26-1941, 75 y.o.   MRN: [id number]  Chief Complaint  Patient presents with  . Follow-up    HPI Patient is in today for follow up on numerous medical conditions. She feels well otday. No recent illness or acute concerns. Heartburn is well controlled on Omeprazole with dietary adjustments. Denies CP/palp/SOB/HA/congestion/fevers/GI or GU c/o. Taking meds as prescribed  Past Medical History:  Diagnosis Date  . Anxiety   . Cataract   . GERD (gastroesophageal reflux disease)   . H/O measles   . H/O mumps   . Heart murmur   . History of chicken pox   . Hyperlipidemia   . Hypertension   . Osteoporosis   . Shingles   . Vitamin D deficiency 02/03/2016    Past Surgical History:  Procedure Laterality Date  . ABDOMINAL HYSTERECTOMY  1972  . APPENDECTOMY    . BLEPHAROPLASTY  2015  . CATARACT EXTRACTION     both eyes  . COLONOSCOPY    . EYE SURGERY Bilateral 2013   b/l cataracts, By Dr Dolores Lory at [facility]  . FOOT SURGERY    . TONSILLECTOMY AND ADENOIDECTOMY      Family History  Problem Relation Age of Onset  . Colon cancer Mother     died of colon ca  . Cancer Mother     breast, colon cancer  . Colon polyps Sister   . Esophageal cancer Neg Hx   . Rectal cancer Neg Hx   . Stomach cancer Neg Hx   . Colon polyps Sister   . Heart disease Father     died of heart attack  . Heart disease Maternal Grandmother   . Heart disease Maternal Grandfather   . Cancer Maternal Aunt     breast  . Asthma Paternal Uncle   . Cancer Maternal Aunt     breast  . Heart disease Brother     MI    Social History   Social History  . Marital status: Married    Spouse name: N/A  . Number of children: N/A  . Years of education: N/A   Occupational History  . Not on file.   Social History Main Topics  . Smoking status: Never Smoker  . Smokeless  tobacco: Never Used  . Alcohol use No  . Drug use: No  . 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 Not on file   Social History Narrative  . No narrative on file    Outpatient Medications Prior to Visit  Medication Sig Dispense Refill  . ranitidine (ZANTAC) 150 MG capsule Take 1 capsule (150 mg total) by mouth 2 (two) times daily. 60 capsule 0  . ALPRAZolam (XANAX) 0.25 MG tablet Take 1 tablet (0.25 mg total) by mouth 2 (two) times daily as needed for anxiety or sleep. 30 tablet 2  . atorvastatin (LIPITOR) 40 MG tablet Take 1 tablet (40 mg total) by mouth daily. (Patient taking differently: Take 1 and 1/2 tablets daily.) 90 tablet 3  . hydrochlorothiazide (HYDRODIURIL) 25 MG tablet Take 1 tablet (25 mg total) by mouth daily. 90 tablet 3  . omeprazole (PRILOSEC) 40 MG capsule Take 1 capsule (40 mg total) by mouth daily. 90 capsule 3  .  Vitamin D, Ergocalciferol, (DRISDOL) 50000 units CAPS capsule Take 1 capsule (50,000 Units total) by mouth every 7 (seven) days. 12 capsule 0  . zoster vaccine live, PF, (ZOSTAVAX) 16109 UNT/0.65ML injection Inject 19,400 Units into the skin once. (Patient not taking: Reported on 03/13/2016) 1 each 0   No facility-administered medications prior to visit.     No Known Allergies  Review of Systems  Constitutional: Negative for fever and malaise/fatigue.  HENT: Negative for congestion.   Eyes: Negative for blurred vision.  Respiratory: Negative for shortness of breath.   Cardiovascular: Negative for chest pain, palpitations and leg swelling.  Gastrointestinal: Negative for abdominal pain, blood in stool and nausea.  Genitourinary: Negative for dysuria and frequency.  Musculoskeletal: Negative for falls.  Skin: Negative for rash.  Neurological: Negative for dizziness, loss of consciousness and headaches.  Endo/Heme/Allergies: Negative for environmental allergies.    Psychiatric/Behavioral: Negative for depression. The patient is not nervous/anxious.        Objective:    Physical Exam  Constitutional: She is oriented to person, place, and time. She appears well-developed and well-nourished. No distress.  HENT:  Head: Normocephalic and atraumatic.  Nose: Nose normal.  Eyes: Right eye exhibits no discharge. Left eye exhibits no discharge.  Neck: Normal range of motion. Neck supple.  Cardiovascular: Normal rate and regular rhythm.   No murmur heard. Pulmonary/Chest: Effort normal and breath sounds normal.  Abdominal: Soft. Bowel sounds are normal. There is no tenderness.  Musculoskeletal: She exhibits no edema.  Neurological: She is alert and oriented to person, place, and time.  Skin: Skin is warm and dry.  Psychiatric: She has a normal mood and affect.  Nursing note and vitals reviewed.   BP (!) 143/71 (BP Location: Left Arm, Patient Position: Sitting, Cuff Size: Normal)   Pulse 76   Temp 98.1 F (36.7 C) (Oral)   Wt 162 lb 12.8 oz (73.8 kg)   SpO2 100%   BMI 27.94 kg/m  Wt Readings from Last 3 Encounters:  08/28/16 162 lb 12.8 oz (73.8 kg)  03/27/16 165 lb (74.8 kg)  03/13/16 165 lb (74.8 kg)     Lab Results  Component Value Date   WBC 4.9 08/28/2016   HGB 12.8 08/28/2016   HCT 38.9 08/28/2016   PLT 270.0 08/28/2016   GLUCOSE 96 08/28/2016   CHOL 227 (H) 08/28/2016   TRIG 141.0 08/28/2016   HDL 67.70 08/28/2016   LDLDIRECT 120.2 07/14/2012   LDLCALC 131 (H) 08/28/2016   ALT 16 08/28/2016   AST 19 08/28/2016   NA 140 08/28/2016   K 4.8 08/28/2016   CL 102 08/28/2016   CREATININE 0.73 08/28/2016   BUN 7 08/28/2016   CO2 30 08/28/2016   TSH 2.66 08/28/2016    Lab Results  Component Value Date   TSH 2.66 08/28/2016   Lab Results  Component Value Date   WBC 4.9 08/28/2016   HGB 12.8 08/28/2016   HCT 38.9 08/28/2016   MCV 79.7 08/28/2016   PLT 270.0 08/28/2016   Lab Results  Component Value Date   NA 140  08/28/2016   K 4.8 08/28/2016   CO2 30 08/28/2016   GLUCOSE 96 08/28/2016   BUN 7 08/28/2016   CREATININE 0.73 08/28/2016   BILITOT 0.4 08/28/2016   ALKPHOS 113 08/28/2016   AST 19 08/28/2016   ALT 16 08/28/2016   PROT 7.4 08/28/2016   ALBUMIN 4.4 08/28/2016   CALCIUM 9.7 08/28/2016   GFR 83.01 08/28/2016   Lab  Results  Component Value Date   CHOL 227 (H) 08/28/2016   Lab Results  Component Value Date   HDL 67.70 08/28/2016   Lab Results  Component Value Date   LDLCALC 131 (H) 08/28/2016   Lab Results  Component Value Date   TRIG 141.0 08/28/2016   Lab Results  Component Value Date   CHOLHDL 3 08/28/2016   No results found for: HGBA1C     Assessment & Plan:   Problem List Items Addressed This Visit    GERD (gastroesophageal reflux disease)    Avoid offending foods, take probiotics. Do not eat large meals in late evening and consider raising head of bed.       Hyperlipidemia    Tolerating statin, encouraged heart healthy diet, avoid trans fats, minimize simple carbs and saturated fats. Increase exercise as tolerated      Relevant Orders   Lipid panel (Completed)   Hypertension    Well controlled, no changes to meds. Encouraged heart healthy diet such as the DASH diet and exercise as tolerated.       Relevant Orders   CBC (Completed)   TSH (Completed)   Comprehensive metabolic panel (Completed)   Osteoporosis    Encouraged to get adequate exercise, calcium and vitamin d intake      Relevant Orders   VITAMIN D 25 Hydroxy (Vit-D Deficiency, Fractures) (Completed)   Vitamin D deficiency    Will check level today      Relevant Orders   VITAMIN D 25 Hydroxy (Vit-D Deficiency, Fractures) (Completed)    Other Visit Diagnoses    Encounter for immunization       Relevant Orders   Flu vaccine HIGH DOSE PF (Completed)      I have discontinued Ms. Almas's ranitidine, zoster vaccine live (PF), and Vitamin D (Ergocalciferol). I am also having her  maintain her omeprazole, ALPRAZolam, atorvastatin, and hydrochlorothiazide.  No orders of the defined types were placed in this encounter.    [name], MD

## 2017-03-02 ENCOUNTER — Encounter: Payer: Self-pay | Admitting: Family Medicine

## 2017-03-02 ENCOUNTER — Ambulatory Visit (INDEPENDENT_AMBULATORY_CARE_PROVIDER_SITE_OTHER): Payer: Medicare Other | Admitting: Family Medicine

## 2017-03-02 ENCOUNTER — Ambulatory Visit (HOSPITAL_BASED_OUTPATIENT_CLINIC_OR_DEPARTMENT_OTHER)
Admission: RE | Admit: 2017-03-02 | Discharge: 2017-03-02 | Disposition: A | Discharge status: Home / Self Care | Payer: Medicare Other | Source: Ambulatory Visit | Attending: Family Medicine | Admitting: Family Medicine

## 2017-03-02 VITALS — BP 128/74 | HR 84 | Temp 97.9°F | Ht 64.0 in | Wt 164.6 lb

## 2017-03-02 DIAGNOSIS — F419 Anxiety disorder, unspecified: Secondary | ICD-10-CM

## 2017-03-02 DIAGNOSIS — F418 Other specified anxiety disorders: Secondary | ICD-10-CM

## 2017-03-02 DIAGNOSIS — M810 Age-related osteoporosis without current pathological fracture: Secondary | ICD-10-CM

## 2017-03-02 DIAGNOSIS — Z1231 Encounter for screening mammogram for malignant neoplasm of breast: Secondary | ICD-10-CM | POA: Diagnosis present

## 2017-03-02 DIAGNOSIS — K219 Gastro-esophageal reflux disease without esophagitis: Secondary | ICD-10-CM

## 2017-03-02 DIAGNOSIS — Z0000 Encounter for general adult medical examination without abnormal findings: Secondary | ICD-10-CM

## 2017-03-02 DIAGNOSIS — Z13820 Encounter for screening for osteoporosis: Secondary | ICD-10-CM | POA: Insufficient documentation

## 2017-03-02 DIAGNOSIS — I10 Essential (primary) hypertension: Secondary | ICD-10-CM

## 2017-03-02 DIAGNOSIS — E559 Vitamin D deficiency, unspecified: Secondary | ICD-10-CM | POA: Diagnosis not present

## 2017-03-02 DIAGNOSIS — F32A Depression, unspecified: Secondary | ICD-10-CM

## 2017-03-02 DIAGNOSIS — E785 Hyperlipidemia, unspecified: Secondary | ICD-10-CM

## 2017-03-02 DIAGNOSIS — F329 Major depressive disorder, single episode, unspecified: Secondary | ICD-10-CM

## 2017-03-02 DIAGNOSIS — Z1239 Encounter for other screening for malignant neoplasm of breast: Secondary | ICD-10-CM

## 2017-03-02 DIAGNOSIS — Z0001 Encounter for general adult medical examination with abnormal findings: Secondary | ICD-10-CM

## 2017-03-02 DIAGNOSIS — N6489 Other specified disorders of breast: Secondary | ICD-10-CM | POA: Diagnosis not present

## 2017-03-02 LAB — COMPREHENSIVE METABOLIC PANEL
ALT: 21 U/L (ref 0–35)
AST: 23 U/L (ref 0–37)
Albumin: 4.6 g/dL (ref 3.5–5.2)
Alkaline Phosphatase: 114 U/L (ref 39–117)
BUN: 12 mg/dL (ref 6–23)
CHLORIDE: 106 meq/L (ref 96–112)
CO2: 27 meq/L (ref 19–32)
CREATININE: 0.72 mg/dL (ref 0.40–1.20)
Calcium: 9.6 mg/dL (ref 8.4–10.5)
GFR: 84.22 mL/min (ref >60.00)
Glucose, Bld: 102 mg/dL — ABNORMAL HIGH (ref 70–99)
Potassium: 4.4 mEq/L (ref 3.5–5.1)
SODIUM: 141 meq/L (ref 135–145)
Total Bilirubin: 0.5 mg/dL (ref 0.2–1.2)
Total Protein: 7.3 g/dL (ref 6.0–8.3)

## 2017-03-02 LAB — CBC
HCT: 39.6 % (ref 36.0–46.0)
Hemoglobin: 12.9 g/dL (ref 12.0–15.0)
MCHC: 32.6 g/dL (ref 30.0–36.0)
MCV: 80.8 fl (ref 78.0–100.0)
Platelets: 284 10*3/uL (ref 150.0–400.0)
RBC: 4.91 Mil/uL (ref 3.87–5.11)
RDW: 14.6 % (ref 11.5–15.5)
WBC: 6.3 10*3/uL (ref 4.0–10.5)

## 2017-03-02 LAB — LIPID PANEL
CHOL/HDL RATIO: 3
CHOLESTEROL: 206 mg/dL — AB (ref 0–200)
HDL: 65.8 mg/dL (ref >39.00)
LDL CALC: 115 mg/dL — AB (ref 0–99)
NonHDL: 140.12
TRIGLYCERIDES: 125 mg/dL (ref 0.0–149.0)
VLDL: 25 mg/dL (ref 0.0–40.0)

## 2017-03-02 LAB — TSH: TSH: 1.78 u[IU]/mL (ref 0.35–4.50)

## 2017-03-02 LAB — VITAMIN D 25 HYDROXY (VIT D DEFICIENCY, FRACTURES): VITD: 33.2 ng/mL (ref 30.00–100.00)

## 2017-03-02 MED ORDER — ATORVASTATIN CALCIUM 40 MG PO TABS: ORAL_TABLET | ORAL | 3 refills | Status: DC

## 2017-03-02 MED ORDER — ALPRAZOLAM 0.25 MG PO TABS: 0.2500 mg | ORAL_TABLET | Freq: Two times a day (BID) | ORAL | 2 refills | Status: DC | PRN

## 2017-03-02 NOTE — Assessment & Plan Note (Addendum)
Not taking D, encouraged to start Vitamin D 2000 IU daily, check level today

## 2017-03-02 NOTE — Assessment & Plan Note (Addendum)
Encouraged to get adequate exercise, calcium and vitamin d intake.Dexa scan ordered. Previously took Actonel and Boniva but increased reflux was noted. Consider Prolia after bone scan

## 2017-03-02 NOTE — Progress Notes (Signed)
Pre visit review using our clinic review tool, if applicable. No additional management support is needed unless otherwise documented below in the visit note. 

## 2017-03-02 NOTE — Patient Instructions (Addendum)
Zantac/Ranitidin Preventive Care 74 Years and Older, Female Preventive care refers to lifestyle choices and visits with your health care provider that can promote health and wellness. What does preventive care include?  A yearly physical exam. This is also called an annual well check.  Dental exams once or twice a year.  Routine eye exams. Ask your health care provider how often you should have your eyes checked.  Personal lifestyle choices, including:  Daily care of your teeth and gums.  Regular physical activity.  Eating a healthy diet.  Avoiding tobacco and drug use.  Limiting alcohol use.  Practicing safe sex.  Taking low-dose aspirin every day.  Taking vitamin and mineral supplements as recommended by your health care provider. What happens during an annual well check? The services and screenings done by your health care provider during your annual well check will depend on your age, overall health, lifestyle risk factors, and family history of disease. Counseling  Your health care provider may ask you questions about your:  Alcohol use.  Tobacco use.  Drug use.  Emotional well-being.  Home and relationship well-being.  Sexual activity.  Eating habits.  History of falls.  Memory and ability to understand (cognition).  Work and work Statistician.  Reproductive health. Screening  You may have the following tests or measurements:  Height, weight, and BMI.  Blood pressure.  Lipid and cholesterol levels. These may be checked every 5 years, or more frequently if you are over 74 years old.  Skin check.  Lung cancer screening. You may have this screening every year starting at age 74 if you have a 30-pack-year history of smoking and currently smoke or have quit within the past 15 years.  Fecal occult blood test (FOBT) of the stool. You may have this test every year starting at age 74.  Flexible sigmoidoscopy or colonoscopy. You may have a sigmoidoscopy  every 5 years or a colonoscopy every 10 years starting at age 74.  Hepatitis C blood test.  Hepatitis B blood test.  Sexually transmitted disease (STD) testing.  Diabetes screening. This is done by checking your blood sugar (glucose) after you have not eaten for a while (fasting). You may have this done every 1-3 years.  Bone density scan. This is done to screen for osteoporosis. You may have this done starting at age 74.  Mammogram. This may be done every 1-2 years. Talk to your health care provider about how often you should have regular mammograms. Talk with your health care provider about your test results, treatment options, and if necessary, the need for more tests. Vaccines  Your health care provider may recommend certain vaccines, such as:  Influenza vaccine. This is recommended every year.  Tetanus, diphtheria, and acellular pertussis (Tdap, Td) vaccine. You may need a Td booster every 10 years.  Varicella vaccine. You may need this if you have not been vaccinated.  Zoster vaccine. You may need this after age 74.  Measles, mumps, and rubella (MMR) vaccine. You may need at least one dose of MMR if you were born in 1957 or later. You may also need a second dose.  Pneumococcal 13-valent conjugate (PCV13) vaccine. One dose is recommended after age 58.  Pneumococcal polysaccharide (PPSV23) vaccine. One dose is recommended after age 9.  Meningococcal vaccine. You may need this if you have certain conditions.  Hepatitis A vaccine. You may need this if you have certain conditions or if you travel or work in places where you may be exposed  to hepatitis A.  Hepatitis B vaccine. You may need this if you have certain conditions or if you travel or work in places where you may be exposed to hepatitis B.  Haemophilus influenzae type b (Hib) vaccine. You may need this if you have certain conditions. Talk to your health care provider about which screenings and vaccines you need and  how often you need them. This information is not intended to replace advice given to you by your health care provider. Make sure you discuss any questions you have with your health care provider. Document Released: 12/07/2015 Document Revised: 07/30/2016 Document Reviewed: 09/11/2015 Elsevier Interactive Patient Education  2017 [employer] Ranitidine 75 mg or 150 mg OTC as needed.  Food Choices for Gastroesophageal Reflux Disease, Adult When you have gastroesophageal reflux disease (GERD), the foods you eat and your eating habits are very important. Choosing the right foods can help ease your discomfort. What guidelines do I need to follow?  Choose fruits, vegetables, whole grains, and low-fat dairy products.  Choose low-fat meat, fish, and poultry.  Limit fats such as oils, salad dressings, butter, nuts, and avocado.  Keep a food diary. This helps you identify foods that cause symptoms.  Avoid foods that cause symptoms. These may be different for everyone.  Eat small meals often instead of 3 large meals a day.  Eat your meals slowly, in a place where you are relaxed.  Limit fried foods.  Cook foods using methods other than frying.  Avoid drinking alcohol.  Avoid drinking large amounts of liquids with your meals.  Avoid bending over or lying down until 2-3 hours after eating. What foods are not recommended? These are some foods and drinks that may make your symptoms worse: Vegetables  Tomatoes. Tomato juice. Tomato and spaghetti sauce. Chili peppers. Onion and garlic. Horseradish. Fruits  Oranges, grapefruit, and lemon (fruit and juice). Meats  High-fat meats, fish, and poultry. This includes hot dogs, ribs, ham, sausage, salami, and bacon. Dairy  Whole milk and chocolate milk. Sour cream. Cream. Butter. Ice cream. Cream cheese. Drinks  Coffee and tea. Bubbly (carbonated) drinks or energy drinks. Condiments  Hot sauce. Barbecue sauce. Sweets/Desserts  Chocolate and  cocoa. Donuts. Peppermint and spearmint. Fats and Oils  High-fat foods. This includes [place] fries and potato chips. Other  Vinegar. Strong spices. This includes black pepper, white pepper, red pepper, cayenne, curry powder, cloves, ginger, and chili powder. The items listed above may not be a complete list of foods and drinks to avoid. Contact your dietitian for more information.  This information is not intended to replace advice given to you by your health care provider. Make sure you discuss any questions you have with your health care provider. Document Released: 05/11/2012 Document Revised: 04/17/2016 Document Reviewed: 09/14/2013 Elsevier Interactive Patient Education  2017 [employer].

## 2017-03-02 NOTE — Assessment & Plan Note (Signed)
Doing well has to use the omeprazole every 3-4 days when she eats fatty foods to late in the day.avoid offending foods

## 2017-03-02 NOTE — Assessment & Plan Note (Signed)
Patient denies any difficulties at home. No trouble with ADLs, depression or falls. See EMR for functional status screen and depression screen. No recent changes to vision or hearing. Is UTD with immunizations. Is UTD with screening. Discussed Advanced Directives. Encouraged heart healthy diet, exercise as tolerated and adequate sleep. See patient's problem list for health risk factors to monitor. See AVS for preventative healthcare recommendation schedule. Given and reviewed copy of ACP documents from [place] Secretary of State and encouraged to complete and return 

## 2017-03-02 NOTE — Assessment & Plan Note (Signed)
Well controlled, no changes to meds. Encouraged heart healthy diet such as the DASH diet and exercise as tolerated.  °

## 2017-03-02 NOTE — Assessment & Plan Note (Signed)
Tolerating statin, encouraged heart healthy diet, avoid trans fats, minimize simple carbs and saturated fats. Increase exercise as tolerated 

## 2017-03-02 NOTE — Progress Notes (Signed)
Patient ID: Isabella Andersen, female   DOB: Oct 05, 1941, 76 y.o.   MRN: [id number]   Subjective:  I acted as a Education administrator for Isabella Andersen, [place], [place]   Patient ID: Isabella Andersen, female    DOB: 09/20/1941, 76 y.o.   MRN: [id number]  Chief Complaint  Patient presents with  . Medicare Wellness Visit  . Gastroesophageal Reflux    Gastroesophageal Reflux  She reports no chest pain or no coughing. This is a chronic problem.    Patient is in today for annual medicare wellness visit. Patient states that she is still having issues with GERD> States that she takes 20mg  OTC prilosec; but wonders if she should be taking 40mg  instead. Patient has a Hx of HTN, GERD, hyperlipidemia, anxiety. Patient has no additional acute concerns noted at this time. She is doing well with ADLs in home. No recent febrile illness or hospitalizations. Denies CP/palp/SOB/HA/congestion/fevers/GI or GU c/o. Taking meds as prescribed  Patient Care Team: Mosie Lukes, MD as PCP - General (Family Medicine) Druscilla Brownie, MD as Consulting Physician (Dermatology)   Past Medical History:  Diagnosis Date  . Anxiety   . Cataract   . GERD (gastroesophageal reflux disease)   . H/O measles   . H/O mumps   . Heart murmur   . History of chicken pox   . Hyperlipidemia   . Hypertension   . Osteoporosis   . Shingles   . Vitamin D deficiency 02/03/2016    Past Surgical History:  Procedure Laterality Date  . ABDOMINAL HYSTERECTOMY  1972  . APPENDECTOMY    . BLEPHAROPLASTY  2015  . CATARACT EXTRACTION     both eyes  . COLONOSCOPY    . EYE SURGERY Bilateral 2013   b/l cataracts, By Dr Dolores Lory at [place]  . FOOT SURGERY    . TONSILLECTOMY AND ADENOIDECTOMY      Family History  Problem Relation Age of Onset  . Colon cancer Mother     died of colon ca  . Cancer Mother     breast, colon cancer  . Colon polyps Sister   . Colon polyps Sister   . Heart disease Father     died of heart attack  . Heart disease  Maternal Grandmother   . Heart disease Maternal Grandfather   . Cancer Maternal Aunt     breast  . Cancer Maternal Aunt     breast  . Heart disease Brother     MI  . Asthma Paternal Uncle   . Esophageal cancer Neg Hx   . Rectal cancer Neg Hx   . Stomach cancer Neg Hx     Social History   Social History  . Marital status: Married    Spouse name: N/A  . Number of children: N/A  . Years of education: N/A   Occupational History  . Not on file.   Social History Main Topics  . Smoking status: Never Smoker  . Smokeless tobacco: Never Used  . Alcohol use No  . Drug use: No  . 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 Not on file   Social History Narrative  . No narrative on file    Outpatient Medications Prior to Visit  Medication Sig Dispense Refill  . hydrochlorothiazide (HYDRODIURIL) 25 MG tablet Take 1 tablet (25 mg total) by mouth daily. 90 tablet 3  . ALPRAZolam (  XANAX) 0.25 MG tablet Take 1 tablet (0.25 mg total) by mouth 2 (two) times daily as needed for anxiety or sleep. 30 tablet 2  . atorvastatin (LIPITOR) 40 MG tablet Take 1 tablet (40 mg total) by mouth daily. (Patient taking differently: Take 1 and 1/2 tablets daily.) 90 tablet 3  . omeprazole (PRILOSEC) 40 MG capsule Take 1 capsule (40 mg total) by mouth daily. 90 capsule 3   No facility-administered medications prior to visit.     No Known Allergies  Review of Systems  Constitutional: Positive for malaise/fatigue. Negative for fever.  HENT: Negative for congestion.   Eyes: Negative for blurred vision.  Respiratory: Negative for cough and shortness of breath.   Cardiovascular: Negative for chest pain, palpitations and leg swelling.  Gastrointestinal: Negative for vomiting.  Musculoskeletal: Negative for back pain.  Skin: Negative for rash.  Neurological: Negative for loss of consciousness and headaches.        Objective:    Physical Exam  Constitutional: She is oriented to person, place, and time. She appears well-developed and well-nourished. No distress.  HENT:  Head: Normocephalic and atraumatic.  Eyes: Conjunctivae are normal.  Neck: Normal range of motion. No thyromegaly present.  Cardiovascular: Normal rate and regular rhythm.   Pulmonary/Chest: Effort normal and breath sounds normal. She has no wheezes.  Abdominal: Soft. Bowel sounds are normal. There is no tenderness.  Musculoskeletal: She exhibits no edema or deformity.  Neurological: She is alert and oriented to person, place, and time.  Skin: Skin is warm and dry. She is not diaphoretic.  Psychiatric: She has a normal mood and affect.    BP 128/74 (BP Location: Left Arm, Patient Position: Sitting, Cuff Size: Normal)   Pulse 84   Temp 97.9 F (36.6 C) (Oral)   Ht 5\' 4"  (1.626 m)   Wt 164 lb 9.6 oz (74.7 kg)   SpO2 97% Comment: RA  BMI 28.25 kg/m  Wt Readings from Last 3 Encounters:  03/02/17 164 lb 9.6 oz (74.7 kg)  08/28/16 162 lb 12.8 oz (73.8 kg)  03/27/16 165 lb (74.8 kg)   BP Readings from Last 3 Encounters:  03/02/17 128/74  08/28/16 (!) 143/71  03/27/16 138/62     Immunization History  Administered Date(s) Administered  . Influenza Split 09/09/2011  . Influenza, High Dose Seasonal PF 08/28/2016  . Influenza, Seasonal, Injecte, Preservative Fre 10/26/2012  . Influenza,inj,Quad PF,36+ Mos 08/29/2013  . Influenza-Unspecified 09/14/2014, 10/25/2015  . Pneumococcal Conjugate-13 12/22/2014  . Pneumococcal Polysaccharide-23 11/25/2007, 01/24/2016  . Tdap 11/25/2007    Health Maintenance  Topic Date Due  . INFLUENZA VACCINE  06/24/2017  . TETANUS/TDAP  11/24/2017  . MAMMOGRAM  01/23/2018  . COLONOSCOPY  03/27/2021  . DEXA SCAN  Completed  . PNA vac Low Risk Adult  Completed    Lab Results  Component Value Date   WBC 4.9 08/28/2016   HGB 12.8 08/28/2016   HCT 38.9 08/28/2016   PLT 270.0 08/28/2016    GLUCOSE 96 08/28/2016   CHOL 227 (H) 08/28/2016   TRIG 141.0 08/28/2016   HDL 67.70 08/28/2016   LDLDIRECT 120.2 07/14/2012   LDLCALC 131 (H) 08/28/2016   ALT 16 08/28/2016   AST 19 08/28/2016   NA 140 08/28/2016   K 4.8 08/28/2016   CL 102 08/28/2016   CREATININE 0.73 08/28/2016   BUN 7 08/28/2016   CO2 30 08/28/2016   TSH 2.66 08/28/2016    Lab Results  Component Value Date   TSH 2.66  08/28/2016   Lab Results  Component Value Date   WBC 4.9 08/28/2016   HGB 12.8 08/28/2016   HCT 38.9 08/28/2016   MCV 79.7 08/28/2016   PLT 270.0 08/28/2016   Lab Results  Component Value Date   NA 140 08/28/2016   K 4.8 08/28/2016   CO2 30 08/28/2016   GLUCOSE 96 08/28/2016   BUN 7 08/28/2016   CREATININE 0.73 08/28/2016   BILITOT 0.4 08/28/2016   ALKPHOS 113 08/28/2016   AST 19 08/28/2016   ALT 16 08/28/2016   PROT 7.4 08/28/2016   ALBUMIN 4.4 08/28/2016   CALCIUM 9.7 08/28/2016   GFR 83.01 08/28/2016   Lab Results  Component Value Date   CHOL 227 (H) 08/28/2016   Lab Results  Component Value Date   HDL 67.70 08/28/2016   Lab Results  Component Value Date   LDLCALC 131 (H) 08/28/2016   Lab Results  Component Value Date   TRIG 141.0 08/28/2016   Lab Results  Component Value Date   CHOLHDL 3 08/28/2016   No results found for: HGBA1C       Assessment & Plan:   Problem List Items Addressed This Visit    GERD (gastroesophageal reflux disease)    Doing well has to use the omeprazole every 3-4 days when she eats fatty foods to late in the day.avoid offending foods       Relevant Medications   atorvastatin (LIPITOR) 40 MG tablet   Hyperlipidemia    Tolerating statin, encouraged heart healthy diet, avoid trans fats, minimize simple carbs and saturated fats. Increase exercise as tolerated      Relevant Medications   atorvastatin (LIPITOR) 40 MG tablet   Other Relevant Orders   Lipid panel   Hypertension    Well controlled, no changes to meds. Encouraged  heart healthy diet such as the DASH diet and exercise as tolerated.       Relevant Medications   atorvastatin (LIPITOR) 40 MG tablet   Other Relevant Orders   CBC   Comprehensive metabolic panel   TSH   Osteoporosis    Encouraged to get adequate exercise, calcium and vitamin d intake.Dexa scan ordered. Previously took Actonel and Boniva but increased reflux was noted. Consider Prolia after bone scan       Relevant Medications   atorvastatin (LIPITOR) 40 MG tablet   Other Relevant Orders   DG Bone Density   Medicare annual wellness visit, subsequent    Patient denies any difficulties at home. No trouble with ADLs, depression or falls. See EMR for functional status screen and depression screen. No recent changes to vision or hearing. Is UTD with immunizations. Is UTD with screening. Discussed Advanced Directives. Encouraged heart healthy diet, exercise as tolerated and adequate sleep. See patient's problem list for health risk factors to monitor. See AVS for preventative healthcare recommendation schedule. Given and reviewed copy of ACP documents from [place] and encouraged to complete and return      Anxiety and depression   Relevant Medications   atorvastatin (LIPITOR) 40 MG tablet   ALPRAZolam (XANAX) 0.25 MG tablet   Vitamin D deficiency    Not taking D, encouraged to start Vitamin D 2000 IU daily, check level today      Relevant Orders   VITAMIN D 25 Hydroxy (Vit-D Deficiency, Fractures)    Other Visit Diagnoses    Breast cancer screening    -  Primary   Relevant Orders   MM Digital Screening  I have changed Ms. Spraker's atorvastatin. I am also having her maintain her omeprazole, hydrochlorothiazide, and ALPRAZolam.  Meds ordered this encounter  Medications  . atorvastatin (LIPITOR) 40 MG tablet    Sig: Take 1 and 1/2 tablets daily.    Dispense:  135 tablet    Refill:  3  . ALPRAZolam (XANAX) 0.25 MG tablet    Sig: Take 1 tablet (0.25 mg total)  by mouth 2 (two) times daily as needed for anxiety or sleep.    Dispense:  30 tablet    Refill:  2    CMA served as scribe during this visit. History, Physical and Plan performed by medical provider. Documentation and orders reviewed and attested to.  Isabella Homans, MD

## 2017-03-03 ENCOUNTER — Telehealth: Payer: Self-pay | Admitting: Family Medicine

## 2017-03-03 NOTE — Telephone Encounter (Signed)
They will pay for her to go the Atorvastatin 80 mg tab, so have her take 1 tab qod and 1/2 tab qod and that will essentially equal 1.5 of the 40 mg tabs every day send in a 30 day supply with 5 rf or a 90 day supply with 1 rf at patient discrtion. INSURANCE!!!!!!!

## 2017-03-03 NOTE — Telephone Encounter (Signed)
Insurance will only pay for ATorvastatin 40 mg once a day they will not pay for her to take 1 and 1/2 daily

## 2017-03-03 NOTE — Telephone Encounter (Signed)
Patient wants to wait on changing the medication. Faxed to pharmacy to keep as is.  Patient will discuss with PCp at next visit.

## 2017-03-03 NOTE — Telephone Encounter (Signed)
Called cell/home number left message to call back. 

## 2017-03-10 ENCOUNTER — Telehealth: Payer: Self-pay | Admitting: Family Medicine

## 2017-03-10 NOTE — Telephone Encounter (Signed)
We can do the orals but the boniva and actonel caused reflux per my notes so all the other oral meds are related to these meds not a lot of other options

## 2017-03-10 NOTE — Telephone Encounter (Signed)
Patient has decided to take something oral for osteoporosis and not the prolia. (mailed a copy of all results) Let me know what and I can send to [facility] in [facility]

## 2017-03-11 MED ORDER — RISEDRONATE SODIUM 35 MG PO TABS: 35.0000 mg | ORAL_TABLET | ORAL | 5 refills | Status: DC

## 2017-03-11 NOTE — Telephone Encounter (Signed)
Patient notified script sent in.

## 2017-03-11 NOTE — Telephone Encounter (Signed)
OK to start Actonel 35 mg tab, 1 tab po q week. Disp #4 with 5 rf

## 2017-03-11 NOTE — Telephone Encounter (Signed)
She will start which ever oral medication you think best and let me know and will send to her pharmacy

## 2017-04-17 ENCOUNTER — Other Ambulatory Visit: Payer: Self-pay | Admitting: Family Medicine

## 2017-04-17 DIAGNOSIS — M810 Age-related osteoporosis without current pathological fracture: Secondary | ICD-10-CM

## 2017-04-17 DIAGNOSIS — K219 Gastro-esophageal reflux disease without esophagitis: Secondary | ICD-10-CM

## 2017-04-17 DIAGNOSIS — I10 Essential (primary) hypertension: Secondary | ICD-10-CM

## 2017-04-17 DIAGNOSIS — F329 Major depressive disorder, single episode, unspecified: Secondary | ICD-10-CM

## 2017-04-17 DIAGNOSIS — F419 Anxiety disorder, unspecified: Principal | ICD-10-CM

## 2017-04-17 DIAGNOSIS — F32A Depression, unspecified: Secondary | ICD-10-CM

## 2017-04-17 DIAGNOSIS — E785 Hyperlipidemia, unspecified: Secondary | ICD-10-CM

## 2017-04-21 ENCOUNTER — Other Ambulatory Visit: Payer: Self-pay | Admitting: Family Medicine

## 2017-04-21 DIAGNOSIS — F329 Major depressive disorder, single episode, unspecified: Secondary | ICD-10-CM

## 2017-04-21 DIAGNOSIS — M810 Age-related osteoporosis without current pathological fracture: Secondary | ICD-10-CM

## 2017-04-21 DIAGNOSIS — K219 Gastro-esophageal reflux disease without esophagitis: Secondary | ICD-10-CM

## 2017-04-21 DIAGNOSIS — F32A Depression, unspecified: Secondary | ICD-10-CM

## 2017-04-21 DIAGNOSIS — F419 Anxiety disorder, unspecified: Principal | ICD-10-CM

## 2017-04-21 DIAGNOSIS — I10 Essential (primary) hypertension: Secondary | ICD-10-CM

## 2017-04-21 DIAGNOSIS — E785 Hyperlipidemia, unspecified: Secondary | ICD-10-CM

## 2017-06-18 DIAGNOSIS — H3530 Unspecified macular degeneration: Secondary | ICD-10-CM | POA: Diagnosis not present

## 2017-07-25 ENCOUNTER — Other Ambulatory Visit: Payer: Self-pay | Admitting: Family Medicine

## 2017-07-25 DIAGNOSIS — F329 Major depressive disorder, single episode, unspecified: Secondary | ICD-10-CM

## 2017-07-25 DIAGNOSIS — I10 Essential (primary) hypertension: Secondary | ICD-10-CM

## 2017-07-25 DIAGNOSIS — E785 Hyperlipidemia, unspecified: Secondary | ICD-10-CM

## 2017-07-25 DIAGNOSIS — K219 Gastro-esophageal reflux disease without esophagitis: Secondary | ICD-10-CM

## 2017-07-25 DIAGNOSIS — M810 Age-related osteoporosis without current pathological fracture: Secondary | ICD-10-CM

## 2017-07-25 DIAGNOSIS — F419 Anxiety disorder, unspecified: Principal | ICD-10-CM

## 2017-09-07 ENCOUNTER — Ambulatory Visit (INDEPENDENT_AMBULATORY_CARE_PROVIDER_SITE_OTHER): Payer: Medicare Other | Admitting: Family Medicine

## 2017-09-07 ENCOUNTER — Encounter: Payer: Self-pay | Admitting: Family Medicine

## 2017-09-07 ENCOUNTER — Ambulatory Visit (HOSPITAL_BASED_OUTPATIENT_CLINIC_OR_DEPARTMENT_OTHER)
Admission: RE | Admit: 2017-09-07 | Discharge: 2017-09-07 | Disposition: A | Discharge status: Home / Self Care | Payer: Medicare Other | Source: Ambulatory Visit | Attending: Family Medicine | Admitting: Family Medicine

## 2017-09-07 VITALS — BP 140/62 | HR 76 | Temp 97.7°F | Resp 18 | Wt 167.2 lb

## 2017-09-07 DIAGNOSIS — M25512 Pain in left shoulder: Secondary | ICD-10-CM

## 2017-09-07 DIAGNOSIS — E782 Mixed hyperlipidemia: Secondary | ICD-10-CM | POA: Diagnosis not present

## 2017-09-07 DIAGNOSIS — I10 Essential (primary) hypertension: Secondary | ICD-10-CM | POA: Diagnosis not present

## 2017-09-07 DIAGNOSIS — E559 Vitamin D deficiency, unspecified: Secondary | ICD-10-CM

## 2017-09-07 DIAGNOSIS — M810 Age-related osteoporosis without current pathological fracture: Secondary | ICD-10-CM | POA: Diagnosis not present

## 2017-09-07 DIAGNOSIS — Z23 Encounter for immunization: Secondary | ICD-10-CM | POA: Diagnosis not present

## 2017-09-07 HISTORY — DX: Pain in left shoulder: M25.512

## 2017-09-07 LAB — COMPREHENSIVE METABOLIC PANEL
ALBUMIN: 4.4 g/dL (ref 3.5–5.2)
ALT: 14 U/L (ref 0–35)
AST: 18 U/L (ref 0–37)
Alkaline Phosphatase: 91 U/L (ref 39–117)
BILIRUBIN TOTAL: 0.3 mg/dL (ref 0.2–1.2)
BUN: 11 mg/dL (ref 6–23)
CALCIUM: 9.5 mg/dL (ref 8.4–10.5)
CHLORIDE: 105 meq/L (ref 96–112)
CO2: 27 mEq/L (ref 19–32)
CREATININE: 0.69 mg/dL (ref 0.40–1.20)
GFR: 88.34 mL/min (ref >60.00)
Glucose, Bld: 95 mg/dL (ref 70–99)
Potassium: 4.2 mEq/L (ref 3.5–5.1)
SODIUM: 140 meq/L (ref 135–145)
Total Protein: 6.9 g/dL (ref 6.0–8.3)

## 2017-09-07 LAB — CBC
HEMATOCRIT: 35.7 % — AB (ref 36.0–46.0)
HEMOGLOBIN: 11.8 g/dL — AB (ref 12.0–15.0)
MCHC: 33 g/dL (ref 30.0–36.0)
MCV: 81.4 fl (ref 78.0–100.0)
PLATELETS: 279 10*3/uL (ref 150.0–400.0)
RBC: 4.38 Mil/uL (ref 3.87–5.11)
RDW: 15 % (ref 11.5–15.5)
WBC: 5.4 10*3/uL (ref 4.0–10.5)

## 2017-09-07 LAB — VITAMIN D 25 HYDROXY (VIT D DEFICIENCY, FRACTURES): VITD: 26.64 ng/mL — AB (ref 30.00–100.00)

## 2017-09-07 LAB — LIPID PANEL
Cholesterol: 209 mg/dL — ABNORMAL HIGH (ref 0–200)
HDL: 60.8 mg/dL (ref >39.00)
LDL CALC: 123 mg/dL — AB (ref 0–99)
NonHDL: 148.25
TRIGLYCERIDES: 128 mg/dL (ref 0.0–149.0)
Total CHOL/HDL Ratio: 3
VLDL: 25.6 mg/dL (ref 0.0–40.0)

## 2017-09-07 LAB — TSH: TSH: 1.66 u[IU]/mL (ref 0.35–4.50)

## 2017-09-07 NOTE — Progress Notes (Signed)
Subjective:  I acted as a Education administrator for Dr. Charlett Blake. [name], [place]  Patient ID: Isabella Andersen, female    DOB: 14-Apr-1941, 76 y.o.   MRN: [id number]  No chief complaint on file.   HPI  Patient is in today for a 6 month follow up. She is following up on her HTN, hyperlipidemia, osteoporosis and vitamin D deficiency. She feels well today. No recent febrile illness or acute hospitalizations. She is staying active and trying to maintain a heart healthy diet. Denies CP/palp/SOB/HA/congestion/fevers/GI or GU c/o. Taking meds as prescribed  Patient Care Team: Mosie Lukes, MD as PCP - General (Family Medicine) Druscilla Brownie, MD as Consulting Physician (Dermatology)   Past Medical History:  Diagnosis Date  . Anxiety   . Cataract   . GERD (gastroesophageal reflux disease)   . H/O measles   . H/O mumps   . Heart murmur   . History of chicken pox   . Hyperlipidemia   . Hypertension   . Left shoulder pain 09/07/2017  . Osteoporosis   . Shingles   . Vitamin D deficiency 02/03/2016    Past Surgical History:  Procedure Laterality Date  . ABDOMINAL HYSTERECTOMY  1972  . APPENDECTOMY    . BLEPHAROPLASTY  2015  . CATARACT EXTRACTION     both eyes  . COLONOSCOPY    . EYE SURGERY Bilateral 2013   b/l cataracts, By Dr Dolores Lory at [place]  . FOOT SURGERY    . TONSILLECTOMY AND ADENOIDECTOMY      Family History  Problem Relation Age of Onset  . Colon cancer Mother        died of colon ca  . Cancer Mother        breast, colon cancer  . Colon polyps Sister   . Colon polyps Sister   . Heart disease Father        died of heart attack  . Heart disease Maternal Grandmother   . Heart disease Maternal Grandfather   . Cancer Maternal Aunt        breast  . Cancer Maternal Aunt        breast  . Heart disease Brother        MI  . Asthma Paternal Uncle   . Esophageal cancer Neg Hx   . Rectal cancer Neg Hx   . Stomach cancer Neg Hx     Social History   Social History  . Marital  status: Married    Spouse name: N/A  . Number of children: N/A  . Years of education: N/A   Occupational History  . Not on file.   Social History Main Topics  . Smoking status: Never Smoker  . Smokeless tobacco: Never Used  . Alcohol use No  . Drug use: No  . 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 Not on file   Social History Narrative  . No narrative on file    Outpatient Medications Prior to Visit  Medication Sig Dispense Refill  . ALPRAZolam (XANAX) 0.25 MG tablet Take 1 tablet (0.25 mg total) by mouth 2 (two) times daily as needed for anxiety or sleep. 30 tablet 2  . atorvastatin (LIPITOR) 40 MG tablet Take 1 and 1/2 tablets daily. 135 tablet 3  . hydrochlorothiazide (HYDRODIURIL) 25 MG tablet TAKE 1 TABLET EVERY DAY 90 tablet 0  . risedronate (ACTONEL) 35 MG tablet Take  1 tablet (35 mg total) by mouth every 7 (seven) days. with water on empty stomach, nothing by mouth or lie down for next 30 minutes. 4 tablet 5  . omeprazole (PRILOSEC) 40 MG capsule Take 1 capsule (40 mg total) by mouth daily. 90 capsule 3   No facility-administered medications prior to visit.     No Known Allergies  Review of Systems  Constitutional: Negative for fever and malaise/fatigue.  HENT: Negative for congestion.   Eyes: Negative for blurred vision.  Respiratory: Negative for cough and shortness of breath.   Cardiovascular: Negative for chest pain, palpitations and leg swelling.  Gastrointestinal: Negative for vomiting.  Musculoskeletal: Negative for back pain.  Skin: Negative for rash.  Neurological: Negative for loss of consciousness and headaches.       Objective:    Physical Exam  Constitutional: She is oriented to person, place, and time. She appears well-developed and well-nourished. No distress.  HENT:  Head: Normocephalic and atraumatic.  Eyes: Conjunctivae are normal.  Neck:  Normal range of motion. No thyromegaly present.  Cardiovascular: Normal rate and regular rhythm.   Pulmonary/Chest: Effort normal and breath sounds normal. She has no wheezes.  Abdominal: Soft. Bowel sounds are normal. There is no tenderness.  Musculoskeletal: Normal range of motion. She exhibits no edema or deformity.  Neurological: She is alert and oriented to person, place, and time.  Skin: Skin is warm and dry. She is not diaphoretic.  Psychiatric: She has a normal mood and affect.    BP 140/62 (BP Location: Left Arm, Patient Position: Sitting, Cuff Size: Normal)   Pulse 76   Temp 97.7 F (36.5 C) (Oral)   Resp 18   Wt 167 lb 3.2 oz (75.8 kg)   SpO2 97%   BMI 28.70 kg/m  Wt Readings from Last 3 Encounters:  09/07/17 167 lb 3.2 oz (75.8 kg)  03/02/17 164 lb 9.6 oz (74.7 kg)  08/28/16 162 lb 12.8 oz (73.8 kg)   BP Readings from Last 3 Encounters:  09/07/17 140/62  03/02/17 128/74  08/28/16 (!) 143/71     Immunization History  Administered Date(s) Administered  . Influenza Split 09/09/2011  . Influenza, High Dose Seasonal PF 08/28/2016, 09/07/2017  . Influenza, Seasonal, Injecte, Preservative Fre 10/26/2012  . Influenza,inj,Quad PF,6+ Mos 08/29/2013  . Influenza-Unspecified 09/14/2014, 10/25/2015  . Pneumococcal Conjugate-13 12/22/2014  . Pneumococcal Polysaccharide-23 11/25/2007, 01/24/2016  . Tdap 11/25/2007    Health Maintenance  Topic Date Due  . INFLUENZA VACCINE  06/24/2017  . TETANUS/TDAP  11/24/2017  . MAMMOGRAM  03/03/2019  . COLONOSCOPY  03/27/2021  . DEXA SCAN  Completed  . PNA vac Low Risk Adult  Completed    Lab Results  Component Value Date   WBC 5.4 09/07/2017   HGB 11.8 (L) 09/07/2017   HCT 35.7 (L) 09/07/2017   PLT 279.0 09/07/2017   GLUCOSE 95 09/07/2017   CHOL 209 (H) 09/07/2017   TRIG 128.0 09/07/2017   HDL 60.80 09/07/2017   LDLDIRECT 120.2 07/14/2012   LDLCALC 123 (H) 09/07/2017   ALT 14 09/07/2017   AST 18 09/07/2017   NA 140  09/07/2017   K 4.2 09/07/2017   CL 105 09/07/2017   CREATININE 0.69 09/07/2017   BUN 11 09/07/2017   CO2 27 09/07/2017   TSH 1.66 09/07/2017    Lab Results  Component Value Date   TSH 1.66 09/07/2017   Lab Results  Component Value Date   WBC 5.4 09/07/2017   HGB 11.8 (L) 09/07/2017  HCT 35.7 (L) 09/07/2017   MCV 81.4 09/07/2017   PLT 279.0 09/07/2017   Lab Results  Component Value Date   NA 140 09/07/2017   K 4.2 09/07/2017   CO2 27 09/07/2017   GLUCOSE 95 09/07/2017   BUN 11 09/07/2017   CREATININE 0.69 09/07/2017   BILITOT 0.3 09/07/2017   ALKPHOS 91 09/07/2017   AST 18 09/07/2017   ALT 14 09/07/2017   PROT 6.9 09/07/2017   ALBUMIN 4.4 09/07/2017   CALCIUM 9.5 09/07/2017   GFR 88.34 09/07/2017   Lab Results  Component Value Date   CHOL 209 (H) 09/07/2017   Lab Results  Component Value Date   HDL 60.80 09/07/2017   Lab Results  Component Value Date   LDLCALC 123 (H) 09/07/2017   Lab Results  Component Value Date   TRIG 128.0 09/07/2017   Lab Results  Component Value Date   CHOLHDL 3 09/07/2017   No results found for: HGBA1C       Assessment & Plan:   Problem List Items Addressed This Visit    Hyperlipidemia    Tolerating statin, encouraged heart healthy diet, avoid trans fats, minimize simple carbs and saturated fats. Increase exercise as tolerated      Relevant Orders   Lipid panel (Completed)   Hypertension    Well controlled, no changes to meds. Encouraged heart healthy diet such as the DASH diet and exercise as tolerated.       Relevant Orders   CBC (Completed)   Comprehensive metabolic panel (Completed)   TSH (Completed)   Osteoporosis    Encouraged to get adequate exercise, calcium and vitamin d intake      Vitamin D deficiency    Vitamin D check today      Relevant Orders   VITAMIN D 25 Hydroxy (Vit-D Deficiency, Fractures) (Completed)   Left shoulder pain    Recurrent flared a couple of days ago after cleaning her  yard and cooking a large meal. Has had trouble in past so try Tylenol and lidocaine and if no improvement call for referral to sports med      Relevant Orders   DG Shoulder Left (Completed)    Other Visit Diagnoses    Needs flu shot    -  Primary   Relevant Orders   Flu vaccine HIGH DOSE PF (Fluzone High dose) (Completed)      I am having Ms. Hausner maintain her omeprazole, atorvastatin, ALPRAZolam, risedronate, and hydrochlorothiazide.  No orders of the defined types were placed in this encounter.   CMA served as Education administrator during this visit. History, Physical and Plan performed by medical provider. Documentation and orders reviewed and attested to.  [name], MD

## 2017-09-07 NOTE — Assessment & Plan Note (Signed)
Tolerating statin, encouraged heart healthy diet, avoid trans fats, minimize simple carbs and saturated fats. Increase exercise as tolerated 

## 2017-09-07 NOTE — Assessment & Plan Note (Signed)
Encouraged to get adequate exercise, calcium and vitamin d intake 

## 2017-09-07 NOTE — Assessment & Plan Note (Signed)
Well controlled, no changes to meds. Encouraged heart healthy diet such as the DASH diet and exercise as tolerated.  °

## 2017-09-07 NOTE — Assessment & Plan Note (Signed)
Vitamin D check today.  

## 2017-09-07 NOTE — Patient Instructions (Addendum)
Tylenol ES 500 mg tabs by mouth twice daily Lidocaine patches daily  Consider sports medicine referral if pain persists.   Shingrix is the new shingles shot 2 shots over 2-6 months.  Shoulder Pain Many things can cause shoulder pain, including:  An injury.  Moving the arm in the same way again and again (overuse).  Joint pain (arthritis).  Follow these instructions at home: Take these actions to help with your pain:  Squeeze a soft ball or a foam pad as much as you can. This helps to prevent swelling. It also makes the arm stronger.  Take over-the-counter and prescription medicines only as told by your doctor.  If told, put ice on the area: ? Put ice in a plastic bag. ? Place a towel between your skin and the bag. ? Leave the ice on for 20 minutes, 2-3 times per day. Stop putting on ice if it does not help with the pain.  If you were given a shoulder sling or immobilizer: ? Wear it as told. ? Remove it to shower or bathe. ? Move your arm as little as possible. ? Keep your hand moving. This helps prevent swelling.  Contact a doctor if:  Your pain gets worse.  Medicine does not help your pain.  You have new pain in your arm, hand, or fingers. Get help right away if:  Your arm, hand, or fingers: ? Tingle. ? Are numb. ? Are swollen. ? Are painful. ? Turn white or blue. This information is not intended to replace advice given to you by your health care provider. Make sure you discuss any questions you have with your health care provider. Document Released: 04/28/2008 Document Revised: 07/06/2016 Document Reviewed: 03/05/2015 Elsevier Interactive Patient Education  [employer].

## 2017-09-07 NOTE — Assessment & Plan Note (Signed)
Recurrent flared a couple of days ago after cleaning her yard and cooking a large meal. Has had trouble in past so try Tylenol and lidocaine and if no improvement call for referral to sports med

## 2017-09-08 MED ORDER — VITAMIN D (ERGOCALCIFEROL) 1.25 MG (50000 UNIT) PO CAPS: 50000.0000 [IU] | ORAL_CAPSULE | ORAL | 4 refills | Status: DC

## 2017-09-08 NOTE — Addendum Note (Signed)
Addended by: Magdalene Molly A on: 09/08/2017 05:03 PM   Modules accepted: Orders

## 2017-09-22 ENCOUNTER — Other Ambulatory Visit: Payer: Self-pay | Admitting: *Deleted

## 2017-09-22 ENCOUNTER — Other Ambulatory Visit (INDEPENDENT_AMBULATORY_CARE_PROVIDER_SITE_OTHER): Payer: Medicare Other

## 2017-09-22 DIAGNOSIS — D649 Anemia, unspecified: Secondary | ICD-10-CM

## 2017-09-22 LAB — FECAL OCCULT BLOOD, IMMUNOCHEMICAL: FECAL OCCULT BLD: NEGATIVE

## 2017-12-25 ENCOUNTER — Other Ambulatory Visit: Payer: Self-pay

## 2017-12-25 MED ORDER — VITAMIN D (ERGOCALCIFEROL) 1.25 MG (50000 UNIT) PO CAPS: 50000.0000 [IU] | ORAL_CAPSULE | ORAL | 4 refills | Status: DC

## 2018-03-04 NOTE — Progress Notes (Signed)
Subjective:   Isabella Andersen is a 77 y.o. female who presents for Medicare Annual (Subsequent) preventive examination. 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  Review of Systems: No ROS.  Medicare Wellness Visit. Additional risk factors are reflected in the social history. Cardiac Risk Factors include: advanced age (>12men, >37 women);dyslipidemia;hypertension Sleep patterns: wakes frequently to urinate. Has Xanax if needed, but doesn't like to take it. Home Safety/Smoke Alarms: Feels safe in home. Smoke alarms in place.  Living environment; residence and Adult nurse: lives with husband in 1 story home.  Seat Belt Safety/Bike Helmet: Wears seat belt.   Female:         Mammo- 03/02/17. Pt would like to do every other year.       Dexa scan- 03/02/17-osteoporosis       CCS-03/27/16. Recall 5 yrs  Objective:     Vitals: BP 140/78 (BP Location: Left Arm, Patient Position: Sitting, Cuff Size: Normal)   Pulse 80   Ht 5\' 4"  (1.626 m)   Wt 170 lb 12.8 oz (77.5 kg)   SpO2 96%   BMI 29.32 kg/m   Body mass index is 29.32 kg/m.  Advanced Directives 03/08/2018 03/02/2017 01/24/2016  Does Patient Have a Medical Advance Directive? Yes Yes;No No  Type of Paramedic of [place];Living will - -  Does patient want to make changes to medical advance directive? No - Patient declined Yes (MAU/Ambulatory/Procedural Areas - Information given) -  Copy of [place] in Chart? No - copy requested - -  Would patient like information on creating a medical advance directive? - - Yes - Scientist, clinical (histocompatibility and immunogenetics) given    Tobacco Social History   Tobacco Use  Smoking Status Never Smoker  Smokeless Tobacco Never Used     Counseling given: Not Answered   Clinical Intake: Pain : No/denies pain    Past Medical History:  Diagnosis Date  . Anxiety   . Cataract   . GERD (gastroesophageal reflux disease)   . H/O measles   . H/O mumps   . Heart murmur   .  History of chicken pox   . Hyperlipidemia   . Hypertension   . Left shoulder pain 09/07/2017  . Osteoporosis   . Shingles   . Vitamin D deficiency 02/03/2016   Past Surgical History:  Procedure Laterality Date  . ABDOMINAL HYSTERECTOMY  1972  . APPENDECTOMY    . BLEPHAROPLASTY  2015  . CATARACT EXTRACTION     both eyes  . COLONOSCOPY    . EYE SURGERY Bilateral 2013   b/l cataracts, By Dr Dolores Lory at [place]  . FOOT SURGERY    . TONSILLECTOMY AND ADENOIDECTOMY     Family History  Problem Relation Age of Onset  . Colon cancer Mother        died of colon ca  . Cancer Mother        breast, colon cancer  . Colon polyps Sister   . Colon polyps Sister   . Heart disease Father        died of heart attack  . Heart disease Maternal Grandmother   . Heart disease Maternal Grandfather   . Cancer Maternal Aunt        breast  . Cancer Maternal Aunt        breast  . Heart disease Brother        MI  . Asthma Paternal Uncle   . Esophageal cancer Neg Hx   .  Rectal cancer Neg Hx   . Stomach cancer Neg Hx    Social History   Socioeconomic History  . Marital status: Married    Spouse name: Not on file  . Number of children: Not on file  . Years of education: Not on file  . Highest education level: Not on file  Occupational History  . Not on file  Social Needs  . Financial resource strain: Not on file  . Food insecurity:    Worry: Not on file    Inability: Not on file  . Transportation needs:    Medical: Not on file    Non-medical: Not on file  Tobacco Use  . Smoking status: Never Smoker  . Smokeless tobacco: Never Used  Substance and Sexual Activity  . Alcohol use: No    Alcohol/week: 0.0 oz  . Drug use: No  . 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 Physical activity:    Days per week: Not on file    Minutes per session: Not on file  . Stress: Not on file  Relationships  . Social  connections:    Talks on phone: Not on file    Gets together: Not on file    Attends religious service: Not on file    Active member of club or organization: Not on file    Attends meetings of clubs or organizations: Not on file    Relationship status: Not on file  Other Topics Concern  . Not on file  Social History Narrative  . Not on file    Outpatient Encounter Medications as of 03/08/2018  Medication Sig  . ALPRAZolam (XANAX) 0.25 MG tablet Take 1 tablet (0.25 mg total) by mouth 2 (two) times daily as needed for anxiety or sleep.  Marland Kitchen atorvastatin (LIPITOR) 40 MG tablet Take 1 and 1/2 tablets daily.  . hydrochlorothiazide (HYDRODIURIL) 25 MG tablet TAKE 1 TABLET EVERY DAY  . risedronate (ACTONEL) 35 MG tablet Take 1 tablet (35 mg total) by mouth every 7 (seven) days. with water on empty stomach, nothing by mouth or lie down for next 30 minutes.  . Vitamin D, Ergocalciferol, (DRISDOL) 50000 units CAPS capsule Take 1 capsule (50,000 Units total) by mouth every 7 (seven) days.  Marland Kitchen omeprazole (PRILOSEC) 40 MG capsule Take 1 capsule (40 mg total) by mouth daily.   No facility-administered encounter medications on file as of 03/08/2018.     Activities of Daily Living In your present state of health, do you have any difficulty performing the following activities: 03/08/2018  Hearing? N  Vision? N  Difficulty concentrating or making decisions? N  Walking or climbing stairs? N  Dressing or bathing? N  Doing errands, shopping? N  Preparing Food and eating ? N  Using the Toilet? N  In the past six months, have you accidently leaked urine? N  Do you have problems with loss of bowel control? N  Managing your Medications? N  Managing your Finances? N  Housekeeping or managing your Housekeeping? N  Some recent data might be hidden    Patient Care Team: Mosie Lukes, MD as PCP - General (Family Medicine) Druscilla Brownie, MD as Consulting Physician (Dermatology)    Assessment:    This is a routine wellness examination for Alece.  Physical assessment deferred to PCP.   Exercise Activities and Dietary recommendations Current Exercise Habits: Home exercise routine, Type of exercise: walking, Time (Minutes):  60, Frequency (Times/Week): 4, Weekly Exercise (Minutes/Week): 240, Intensity: Mild, Exercise limited by: None identified Diet (meal preparation, eat out, water intake, caffeinated beverages, dairy products, fruits and vegetables): in general, an "unhealthy" diet Instructed on a healthier diet.    Goals    . Maintain current health and independence       Fall Risk Fall Risk  03/08/2018 03/02/2017 01/24/2016 12/22/2014 08/29/2013  Falls in the past year? No No No No No    Depression Screen PHQ 2/9 Scores 03/08/2018 03/02/2017 01/24/2016 12/22/2014  PHQ - 2 Score 0 0 0 0     Cognitive Function MMSE - Mini Mental State Exam 03/08/2018  Orientation to time 5  Orientation to Place 5  Registration 3  Attention/ Calculation 5  Recall 3  Language- name 2 objects 2  Language- repeat 1  Language- follow 3 step command 3  Language- read & follow direction 1  Write a sentence 1  Copy design 1  Total score 30        Immunization History  Administered Date(s) Administered  . Influenza Split 09/09/2011  . Influenza, High Dose Seasonal PF 08/28/2016, 09/07/2017  . Influenza, Seasonal, Injecte, Preservative Fre 10/26/2012  . Influenza,inj,Quad PF,6+ Mos 08/29/2013  . Influenza-Unspecified 09/14/2014, 10/25/2015  . Pneumococcal Conjugate-13 12/22/2014  . Pneumococcal Polysaccharide-23 11/25/2007, 01/24/2016  . Tdap 11/25/2007   Screening Tests Health Maintenance  Topic Date Due  . TETANUS/TDAP  11/24/2017  . INFLUENZA VACCINE  06/24/2018  . MAMMOGRAM  03/03/2019  . COLONOSCOPY  03/27/2021  . DEXA SCAN  Completed  . PNA vac Low Risk Adult  Completed      Plan:   Follow up with PCP today as scheduled.  Continue to eat heart healthy diet (full of fruits,  vegetables, whole grains, lean protein, water--limit salt, fat, and sugar intake) and increase physical activity as tolerated.  Continue doing brain stimulating activities (puzzles, reading, adult coloring books, staying active) to keep memory sharp.   Bring a copy of your living will and/or healthcare power of attorney to your next office visit.   I have personally reviewed and noted the following in the patient's chart:   . Medical and social history . Use of alcohol, tobacco or illicit drugs  . Current medications and supplements . Functional ability and status . Nutritional status . Physical activity . Advanced directives . List of other physicians . Hospitalizations, surgeries, and ER visits in previous 12 months . Vitals . Screenings to include cognitive, depression, and falls . Referrals and appointments  In addition, I have reviewed and discussed with patient certain preventive protocols, quality metrics, and best practice recommendations. A written personalized care plan for preventive services as well as general preventive health recommendations were provided to patient.     Shela Nevin, [place]  03/08/2018

## 2018-03-08 ENCOUNTER — Ambulatory Visit (INDEPENDENT_AMBULATORY_CARE_PROVIDER_SITE_OTHER): Payer: Medicare Other | Admitting: *Deleted

## 2018-03-08 ENCOUNTER — Encounter: Payer: Self-pay | Admitting: *Deleted

## 2018-03-08 ENCOUNTER — Ambulatory Visit (HOSPITAL_BASED_OUTPATIENT_CLINIC_OR_DEPARTMENT_OTHER)
Admission: RE | Admit: 2018-03-08 | Discharge: 2018-03-08 | Disposition: A | Discharge status: Home / Self Care | Payer: Medicare Other | Source: Ambulatory Visit | Attending: Family Medicine | Admitting: Family Medicine

## 2018-03-08 ENCOUNTER — Ambulatory Visit (INDEPENDENT_AMBULATORY_CARE_PROVIDER_SITE_OTHER): Payer: Medicare Other | Admitting: Family Medicine

## 2018-03-08 VITALS — BP 140/78 | HR 80 | Ht 64.0 in | Wt 170.8 lb

## 2018-03-08 DIAGNOSIS — F329 Major depressive disorder, single episode, unspecified: Secondary | ICD-10-CM

## 2018-03-08 DIAGNOSIS — E782 Mixed hyperlipidemia: Secondary | ICD-10-CM

## 2018-03-08 DIAGNOSIS — R0600 Dyspnea, unspecified: Secondary | ICD-10-CM

## 2018-03-08 DIAGNOSIS — Z79899 Other long term (current) drug therapy: Secondary | ICD-10-CM

## 2018-03-08 DIAGNOSIS — F419 Anxiety disorder, unspecified: Secondary | ICD-10-CM | POA: Diagnosis not present

## 2018-03-08 DIAGNOSIS — R05 Cough: Secondary | ICD-10-CM | POA: Diagnosis not present

## 2018-03-08 DIAGNOSIS — K625 Hemorrhage of anus and rectum: Secondary | ICD-10-CM | POA: Diagnosis not present

## 2018-03-08 DIAGNOSIS — I10 Essential (primary) hypertension: Secondary | ICD-10-CM

## 2018-03-08 DIAGNOSIS — K219 Gastro-esophageal reflux disease without esophagitis: Secondary | ICD-10-CM

## 2018-03-08 DIAGNOSIS — Z0000 Encounter for general adult medical examination without abnormal findings: Secondary | ICD-10-CM

## 2018-03-08 DIAGNOSIS — K449 Diaphragmatic hernia without obstruction or gangrene: Secondary | ICD-10-CM | POA: Insufficient documentation

## 2018-03-08 DIAGNOSIS — E559 Vitamin D deficiency, unspecified: Secondary | ICD-10-CM | POA: Diagnosis not present

## 2018-03-08 DIAGNOSIS — M810 Age-related osteoporosis without current pathological fracture: Secondary | ICD-10-CM

## 2018-03-08 DIAGNOSIS — R059 Cough, unspecified: Secondary | ICD-10-CM

## 2018-03-08 DIAGNOSIS — D509 Iron deficiency anemia, unspecified: Secondary | ICD-10-CM

## 2018-03-08 DIAGNOSIS — R918 Other nonspecific abnormal finding of lung field: Secondary | ICD-10-CM | POA: Insufficient documentation

## 2018-03-08 LAB — LIPID PANEL
CHOL/HDL RATIO: 3
Cholesterol: 197 mg/dL (ref 0–200)
HDL: 65.8 mg/dL (ref >39.00)
LDL Cholesterol: 114 mg/dL — ABNORMAL HIGH (ref 0–99)
NonHDL: 130.88
TRIGLYCERIDES: 84 mg/dL (ref 0.0–149.0)
VLDL: 16.8 mg/dL (ref 0.0–40.0)

## 2018-03-08 LAB — COMPREHENSIVE METABOLIC PANEL
ALK PHOS: 102 U/L (ref 39–117)
ALT: 11 U/L (ref 0–35)
AST: 13 U/L (ref 0–37)
Albumin: 4.2 g/dL (ref 3.5–5.2)
BILIRUBIN TOTAL: 0.4 mg/dL (ref 0.2–1.2)
BUN: 9 mg/dL (ref 6–23)
CALCIUM: 9.1 mg/dL (ref 8.4–10.5)
CO2: 25 meq/L (ref 19–32)
Chloride: 108 mEq/L (ref 96–112)
Creatinine, Ser: 0.7 mg/dL (ref 0.40–1.20)
GFR: 86.77 mL/min (ref >60.00)
Glucose, Bld: 100 mg/dL — ABNORMAL HIGH (ref 70–99)
Potassium: 4.5 mEq/L (ref 3.5–5.1)
Sodium: 141 mEq/L (ref 135–145)
Total Protein: 6.8 g/dL (ref 6.0–8.3)

## 2018-03-08 LAB — CBC
HCT: 30.4 % — ABNORMAL LOW (ref 36.0–46.0)
HEMOGLOBIN: 9.5 g/dL — AB (ref 12.0–15.0)
MCHC: 31.3 g/dL (ref 30.0–36.0)
MCV: 71.1 fl — ABNORMAL LOW (ref 78.0–100.0)
PLATELETS: 329 10*3/uL (ref 150.0–400.0)
RBC: 4.28 Mil/uL (ref 3.87–5.11)
RDW: 16.7 % — ABNORMAL HIGH (ref 11.5–15.5)
WBC: 4.5 10*3/uL (ref 4.0–10.5)

## 2018-03-08 LAB — VITAMIN D 25 HYDROXY (VIT D DEFICIENCY, FRACTURES): VITD: 34.61 ng/mL (ref 30.00–100.00)

## 2018-03-08 LAB — TSH: TSH: 1.49 u[IU]/mL (ref 0.35–4.50)

## 2018-03-08 MED ORDER — ALPRAZOLAM 0.25 MG PO TABS: 0.2500 mg | ORAL_TABLET | Freq: Two times a day (BID) | ORAL | 2 refills | Status: DC | PRN

## 2018-03-08 MED ORDER — RANITIDINE HCL 300 MG PO CAPS: 300.0000 mg | ORAL_CAPSULE | Freq: Every evening | ORAL | 3 refills | Status: DC

## 2018-03-08 MED ORDER — BENZONATATE 100 MG PO CAPS: 100.0000 mg | ORAL_CAPSULE | Freq: Two times a day (BID) | ORAL | 3 refills | Status: DC | PRN

## 2018-03-08 MED ORDER — ALPRAZOLAM 0.25 MG PO TABS: 0.2500 mg | ORAL_TABLET | Freq: Two times a day (BID) | ORAL | 2 refills | Status: AC | PRN

## 2018-03-08 NOTE — Patient Instructions (Signed)
Avoid offending foods, start probiotics. Do not eat large meals in late evening and consider raising head of bed.  Food Choices for Gastroesophageal Reflux Disease, Adult When you have gastroesophageal reflux disease (GERD), the foods you eat and your eating habits are very important. Choosing the right foods can help ease your discomfort. What guidelines do I need to follow?  Choose fruits, vegetables, whole grains, and low-fat dairy products.  Choose low-fat meat, fish, and poultry.  Limit fats such as oils, salad dressings, butter, nuts, and avocado.  Keep a food diary. This helps you identify foods that cause symptoms.  Avoid foods that cause symptoms. These may be different for everyone.  Eat small meals often instead of 3 large meals a day.  Eat your meals slowly, in a place where you are relaxed.  Limit fried foods.  Cook foods using methods other than frying.  Avoid drinking alcohol.  Avoid drinking large amounts of liquids with your meals.  Avoid bending over or lying down until 2-3 hours after eating. What foods are not recommended? These are some foods and drinks that may make your symptoms worse: Vegetables Tomatoes. Tomato juice. Tomato and spaghetti sauce. Chili peppers. Onion and garlic. Horseradish. Fruits Oranges, grapefruit, and lemon (fruit and juice). Meats High-fat meats, fish, and poultry. This includes hot dogs, ribs, ham, sausage, salami, and bacon. Dairy Whole milk and chocolate milk. Sour cream. Cream. Butter. Ice cream. Cream cheese. Drinks Coffee and tea. Bubbly (carbonated) drinks or energy drinks. Condiments Hot sauce. Barbecue sauce. Sweets/Desserts Chocolate and cocoa. Donuts. Peppermint and spearmint. Fats and Oils High-fat foods. This includes [place] fries and potato chips. Other Vinegar. Strong spices. This includes black pepper, white pepper, red pepper, cayenne, curry powder, cloves, ginger, and chili powder. The items listed above  may not be a complete list of foods and drinks to avoid. Contact your dietitian for more information. This information is not intended to replace advice given to you by your health care provider. Make sure you discuss any questions you have with your health care provider. Document Released: 05/11/2012 Document Revised: 04/17/2016 Document Reviewed: 09/14/2013 Elsevier Interactive Patient Education  2017 [employer].

## 2018-03-08 NOTE — Patient Instructions (Signed)
Continue to eat heart healthy diet (full of fruits, vegetables, whole grains, lean protein, water--limit salt, fat, and sugar intake) and increase physical activity as tolerated.  Continue doing brain stimulating activities (puzzles, reading, adult coloring books, staying active) to keep memory sharp.   Bring a copy of your living will and/or healthcare power of attorney to your next office visit.   Isabella Andersen , Thank you for taking time to come for your Medicare Wellness Visit. I appreciate your ongoing commitment to your health goals. Please review the following plan we discussed and let me know if I can assist you in the future.   These are the goals we discussed: Goals    . Maintain current health and independence       This is a list of the screening recommended for you and due dates:  Health Maintenance  Topic Date Due  . Tetanus Vaccine  11/24/2017  . Flu Shot  06/24/2018  . Mammogram  03/03/2019  . Colon Cancer Screening  03/27/2021  . DEXA scan (bone density measurement)  Completed  . Pneumonia vaccines  Completed    Health Maintenance for Postmenopausal Women Menopause is a normal process in which your reproductive ability comes to an end. This process happens gradually over a span of months to years, usually between the ages of 63 and 67. Menopause is complete when you have missed 12 consecutive menstrual periods. It is important to talk with your health care provider about some of the most common conditions that affect postmenopausal women, such as heart disease, cancer, and bone loss (osteoporosis). Adopting a healthy lifestyle and getting preventive care can help to promote your health and wellness. Those actions can also lower your chances of developing some of these common conditions. What should I know about menopause? During menopause, you may experience a number of symptoms, such as:  Moderate-to-severe hot flashes.  Night sweats.  Decrease in sex  drive.  Mood swings.  Headaches.  Tiredness.  Irritability.  Memory problems.  Insomnia.  Choosing to treat or not to treat menopausal changes is an individual decision that you make with your health care provider. What should I know about hormone replacement therapy and supplements? Hormone therapy products are effective for treating symptoms that are associated with menopause, such as hot flashes and night sweats. Hormone replacement carries certain risks, especially as you become older. If you are thinking about using estrogen or estrogen with progestin treatments, discuss the benefits and risks with your health care provider. What should I know about heart disease and stroke? Heart disease, heart attack, and stroke become more likely as you age. This may be due, in part, to the hormonal changes that your body experiences during menopause. These can affect how your body processes dietary fats, triglycerides, and cholesterol. Heart attack and stroke are both medical emergencies. There are many things that you can do to help prevent heart disease and stroke:  Have your blood pressure checked at least every 1-2 years. High blood pressure causes heart disease and increases the risk of stroke.  If you are 67-15 years old, ask your health care provider if you should take aspirin to prevent a heart attack or a stroke.  Do not use any tobacco products, including cigarettes, chewing tobacco, or electronic cigarettes. If you need help quitting, ask your health care provider.  It is important to eat a healthy diet and maintain a healthy weight. ? Be sure to include plenty of vegetables, fruits, low-fat dairy products,  and lean protein. ? Avoid eating foods that are high in solid fats, added sugars, or salt (sodium).  Get regular exercise. This is one of the most important things that you can do for your health. ? Try to exercise for at least 150 minutes each week. The type of exercise that  you do should increase your heart rate and make you sweat. This is known as moderate-intensity exercise. ? Try to do strengthening exercises at least twice each week. Do these in addition to the moderate-intensity exercise.  Know your numbers.Ask your health care provider to check your cholesterol and your blood glucose. Continue to have your blood tested as directed by your health care provider.  What should I know about cancer screening? There are several types of cancer. Take the following steps to reduce your risk and to catch any cancer development as early as possible. Breast Cancer  Practice breast self-awareness. ? This means understanding how your breasts normally appear and feel. ? It also means doing regular breast self-exams. Let your health care provider know about any changes, no matter how small.  If you are 107 or older, have a clinician do a breast exam (clinical breast exam or CBE) every year. Depending on your age, family history, and medical history, it may be recommended that you also have a yearly breast X-ray (mammogram).  If you have a family history of breast cancer, talk with your health care provider about genetic screening.  If you are at high risk for breast cancer, talk with your health care provider about having an MRI and a mammogram every year.  Breast cancer (BRCA) gene test is recommended for women who have family members with BRCA-related cancers. Results of the assessment will determine the need for genetic counseling and BRCA1 and for BRCA2 testing. BRCA-related cancers include these types: ? Breast. This occurs in males or females. ? Ovarian. ? Tubal. This may also be called fallopian tube cancer. ? Cancer of the abdominal or pelvic lining (peritoneal cancer). ? Prostate. ? Pancreatic.  Cervical, Uterine, and Ovarian Cancer Your health care provider may recommend that you be screened regularly for cancer of the pelvic organs. These include your  ovaries, uterus, and vagina. This screening involves a pelvic exam, which includes checking for microscopic changes to the surface of your cervix (Pap test).  For women ages 21-65, health care providers may recommend a pelvic exam and a Pap test every three years. For women ages 27-65, they may recommend the Pap test and pelvic exam, combined with testing for human papilloma virus (HPV), every five years. Some types of HPV increase your risk of cervical cancer. Testing for HPV may also be done on women of any age who have unclear Pap test results.  Other health care providers may not recommend any screening for nonpregnant women who are considered low risk for pelvic cancer and have no symptoms. Ask your health care provider if a screening pelvic exam is right for you.  If you have had past treatment for cervical cancer or a condition that could lead to cancer, you need Pap tests and screening for cancer for at least 20 years after your treatment. If Pap tests have been discontinued for you, your risk factors (such as having a new sexual partner) need to be reassessed to determine if you should start having screenings again. Some women have medical problems that increase the chance of getting cervical cancer. In these cases, your health care provider may recommend that you  have screening and Pap tests more often.  If you have a family history of uterine cancer or ovarian cancer, talk with your health care provider about genetic screening.  If you have vaginal bleeding after reaching menopause, tell your health care provider.  There are currently no reliable tests available to screen for ovarian cancer.  Lung Cancer Lung cancer screening is recommended for adults 63-80 years old who are at high risk for lung cancer because of a history of smoking. A yearly low-dose CT scan of the lungs is recommended if you:  Currently smoke.  Have a history of at least 30 pack-years of smoking and you currently  smoke or have quit within the past 15 years. A pack-year is smoking an average of one pack of cigarettes per day for one year.  Yearly screening should:  Continue until it has been 15 years since you quit.  Stop if you develop a health problem that would prevent you from having lung cancer treatment.  Colorectal Cancer  This type of cancer can be detected and can often be prevented.  Routine colorectal cancer screening usually begins at age 97 and continues through age 68.  If you have risk factors for colon cancer, your health care provider may recommend that you be screened at an earlier age.  If you have a family history of colorectal cancer, talk with your health care provider about genetic screening.  Your health care provider may also recommend using home test kits to check for hidden blood in your stool.  A small camera at the end of a tube can be used to examine your colon directly (sigmoidoscopy or colonoscopy). This is done to check for the earliest forms of colorectal cancer.  Direct examination of the colon should be repeated every 5-10 years until age 53. However, if early forms of precancerous polyps or small growths are found or if you have a family history or genetic risk for colorectal cancer, you may need to be screened more often.  Skin Cancer  Check your skin from head to toe regularly.  Monitor any moles. Be sure to tell your health care provider: ? About any new moles or changes in moles, especially if there is a change in a mole's shape or color. ? If you have a mole that is larger than the size of a pencil eraser.  If any of your family members has a history of skin cancer, especially at a young age, talk with your health care provider about genetic screening.  Always use sunscreen. Apply sunscreen liberally and repeatedly throughout the day.  Whenever you are outside, protect yourself by wearing long sleeves, pants, a wide-brimmed hat, and  sunglasses.  What should I know about osteoporosis? Osteoporosis is a condition in which bone destruction happens more quickly than new bone creation. After menopause, you may be at an increased risk for osteoporosis. To help prevent osteoporosis or the bone fractures that can happen because of osteoporosis, the following is recommended:  If you are 47-74 years old, get at least 1,000 mg of calcium and at least 600 mg of vitamin D per day.  If you are older than age 61 but younger than age 71, get at least 1,200 mg of calcium and at least 600 mg of vitamin D per day.  If you are older than age 26, get at least 1,200 mg of calcium and at least 800 mg of vitamin D per day.  Smoking and excessive alcohol intake increase the  risk of osteoporosis. Eat foods that are rich in calcium and vitamin D, and do weight-bearing exercises several times each week as directed by your health care provider. What should I know about how menopause affects my mental health? Depression may occur at any age, but it is more common as you become older. Common symptoms of depression include:  Low or sad mood.  Changes in sleep patterns.  Changes in appetite or eating patterns.  Feeling an overall lack of motivation or enjoyment of activities that you previously enjoyed.  Frequent crying spells.  Talk with your health care provider if you think that you are experiencing depression. What should I know about immunizations? It is important that you get and maintain your immunizations. These include:  Tetanus, diphtheria, and pertussis (Tdap) booster vaccine.  Influenza every year before the flu season begins.  Pneumonia vaccine.  Shingles vaccine.  Your health care provider may also recommend other immunizations. This information is not intended to replace advice given to you by your health care provider. Make sure you discuss any questions you have with your health care provider. Document Released:  01/02/2006 Document Revised: 05/30/2016 Document Reviewed: 08/14/2015 Elsevier Interactive Patient Education  2018 [employer].

## 2018-03-08 NOTE — Assessment & Plan Note (Signed)
Has had some infrequent panic attacks and she uses Alprazolam prn with good results. It helps

## 2018-03-08 NOTE — Assessment & Plan Note (Signed)
Well controlled, no changes to meds. Encouraged heart healthy diet such as the DASH diet and exercise as tolerated.  °

## 2018-03-08 NOTE — Assessment & Plan Note (Signed)
Has been noting worsening over 20 years and Omeprazole is no longer adequate. Has symptoms daily. Will add the Ranitidine at bedtime and refer to gastroenterology dr Fuller Plan for further consideration. Avoid offending foods, start probiotics. Do not eat large meals in late evening and consider raising head of bed.

## 2018-03-08 NOTE — Assessment & Plan Note (Signed)
Check level 

## 2018-03-08 NOTE — Progress Notes (Signed)
Subjective:  I acted as a Education administrator for Dr. Charlett Blake. [name], [place]  Patient ID: Isabella Andersen, female    DOB: 27-Nov-1940, 77 y.o.   MRN: [id number]  No chief complaint on file.   HPI  Patient is in today for 6 month follow up and she is doing wellmost days but is having persistent trouble with heartburn. She has been taking her Omeprazole daily and still has symptoms most days. Also notes she has had a persistent dry cough which varies in intensity since Thanksgiving. Not productive most of the time. She also notes increasing irritability with the stress in her life. No anhedonia or suicidal ideation. Denies CP/palp/SOB/HA/congestion/fevers/GI or GU c/o. Taking meds as prescribed  Patient Care Team: Mosie Lukes, MD as PCP - General (Family Medicine) Druscilla Brownie, MD as Consulting Physician (Dermatology)   Past Medical History:  Diagnosis Date  . Anxiety   . Cataract   . GERD (gastroesophageal reflux disease)   . H/O measles   . H/O mumps   . Heart murmur   . History of chicken pox   . Hyperlipidemia   . Hypertension   . Left shoulder pain 09/07/2017  . Osteoporosis   . Shingles   . Vitamin D deficiency 02/03/2016    Past Surgical History:  Procedure Laterality Date  . ABDOMINAL HYSTERECTOMY  1972  . APPENDECTOMY    . BLEPHAROPLASTY  2015  . CATARACT EXTRACTION     both eyes  . COLONOSCOPY    . EYE SURGERY Bilateral 2013   b/l cataracts, By Dr Dolores Lory at [facility]  . FOOT SURGERY    . TONSILLECTOMY AND ADENOIDECTOMY      Family History  Problem Relation Age of Onset  . Colon cancer Mother        died of colon ca  . Cancer Mother        breast, colon cancer  . Colon polyps Sister   . Colon polyps Sister   . Heart disease Father        died of heart attack  . Heart disease Maternal Grandmother   . Heart disease Maternal Grandfather   . Cancer Maternal Aunt        breast  . Cancer Maternal Aunt        breast  . Heart disease Brother        MI  . Asthma  Paternal Uncle   . Esophageal cancer Neg Hx   . Rectal cancer Neg Hx   . Stomach cancer Neg Hx     Social History   Socioeconomic History  . Marital status: Married    Spouse name: Not on file  . Number of children: Not on file  . Years of education: Not on file  . Highest education level: Not on file  Occupational History  . Not on file  Social Needs  . Financial resource strain: Not on file  . Food insecurity:    Worry: Not on file    Inability: Not on file  . Transportation needs:    Medical: Not on file    Non-medical: Not on file  Tobacco Use  . Smoking status: Never Smoker  . Smokeless tobacco: Never Used  Substance and Sexual Activity  . Alcohol use: No    Alcohol/week: 0.0 oz  . Drug use: No  . 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 Physical activity:  Days per week: Not on file    Minutes per session: Not on file  . Stress: Not on file  Relationships  . Social connections:    Talks on phone: Not on file    Gets together: Not on file    Attends religious service: Not on file    Active member of club or organization: Not on file    Attends meetings of clubs or organizations: Not on file    Relationship status: Not on file  . Intimate partner violence:    Fear of current or ex partner: Not on file    Emotionally abused: Not on file    Physically abused: Not on file    Forced sexual activity: Not on file  Other Topics Concern  . Not on file  Social History Narrative  . Not on file    Outpatient Medications Prior to Visit  Medication Sig Dispense Refill  . atorvastatin (LIPITOR) 40 MG tablet Take 1 and 1/2 tablets daily. 135 tablet 3  . hydrochlorothiazide (HYDRODIURIL) 25 MG tablet TAKE 1 TABLET EVERY DAY 90 tablet 0  . omeprazole (PRILOSEC) 40 MG capsule Take 1 capsule (40 mg total) by mouth daily. 90 capsule 3  . risedronate (ACTONEL) 35 MG tablet Take 1 tablet (35  mg total) by mouth every 7 (seven) days. with water on empty stomach, nothing by mouth or lie down for next 30 minutes. 4 tablet 5  . Vitamin D, Ergocalciferol, (DRISDOL) 50000 units CAPS capsule Take 1 capsule (50,000 Units total) by mouth every 7 (seven) days. 4 capsule 4  . ALPRAZolam (XANAX) 0.25 MG tablet Take 1 tablet (0.25 mg total) by mouth 2 (two) times daily as needed for anxiety or sleep. 30 tablet 2   No facility-administered medications prior to visit.     No Known Allergies  Review of Systems  Constitutional: Negative for fever and malaise/fatigue.  HENT: Negative for congestion.   Eyes: Negative for blurred vision.  Respiratory: Negative for shortness of breath.   Cardiovascular: Negative for chest pain, palpitations and leg swelling.  Gastrointestinal: Positive for abdominal pain and heartburn. Negative for blood in stool, constipation, diarrhea, nausea and vomiting.  Genitourinary: Negative for dysuria and frequency.  Musculoskeletal: Negative for falls.  Skin: Negative for rash.  Neurological: Negative for dizziness, loss of consciousness and headaches.  Endo/Heme/Allergies: Negative for environmental allergies.  Psychiatric/Behavioral: Negative for depression. The patient is not nervous/anxious.        Objective:    Physical Exam  Constitutional: She is oriented to person, place, and time. She appears well-developed and well-nourished. No distress.  HENT:  Head: Normocephalic and atraumatic.  Nose: Nose normal.  Eyes: Right eye exhibits no discharge. Left eye exhibits no discharge.  Neck: Normal range of motion. Neck supple.  Cardiovascular: Normal rate and regular rhythm.  No murmur heard. Pulmonary/Chest: Effort normal and breath sounds normal.  Abdominal: Soft. Bowel sounds are normal. There is no tenderness.  Musculoskeletal: She exhibits no edema.  Neurological: She is alert and oriented to person, place, and time.  Skin: Skin is warm and dry.    Psychiatric: She has a normal mood and affect.  Nursing note and vitals reviewed.   There were no vitals taken for this visit. Wt Readings from Last 3 Encounters:  03/08/18 170 lb 12.8 oz (77.5 kg)  09/07/17 167 lb 3.2 oz (75.8 kg)  03/02/17 164 lb 9.6 oz (74.7 kg)   BP Readings from Last 3 Encounters:  03/08/18 140/78  09/07/17 140/62  03/02/17 128/74     Immunization History  Administered Date(s) Administered  . Influenza Split 09/09/2011  . Influenza, High Dose Seasonal PF 08/28/2016, 09/07/2017  . Influenza, Seasonal, Injecte, Preservative Fre 10/26/2012  . Influenza,inj,Quad PF,6+ Mos 08/29/2013  . Influenza-Unspecified 09/14/2014, 10/25/2015  . Pneumococcal Conjugate-13 12/22/2014  . Pneumococcal Polysaccharide-23 11/25/2007, 01/24/2016  . Tdap 11/25/2007    Health Maintenance  Topic Date Due  . TETANUS/TDAP  11/24/2017  . INFLUENZA VACCINE  06/24/2018  . MAMMOGRAM  03/03/2019  . COLONOSCOPY  03/27/2021  . DEXA SCAN  Completed  . PNA vac Low Risk Adult  Completed    Lab Results  Component Value Date   WBC 4.5 03/08/2018   HGB 9.5 (L) 03/08/2018   HCT 30.4 (L) 03/08/2018   PLT 329.0 03/08/2018   GLUCOSE 100 (H) 03/08/2018   CHOL 197 03/08/2018   TRIG 84.0 03/08/2018   HDL 65.80 03/08/2018   LDLDIRECT 120.2 07/14/2012   LDLCALC 114 (H) 03/08/2018   ALT 11 03/08/2018   AST 13 03/08/2018   NA 141 03/08/2018   K 4.5 03/08/2018   CL 108 03/08/2018   CREATININE 0.70 03/08/2018   BUN 9 03/08/2018   CO2 25 03/08/2018   TSH 1.49 03/08/2018    Lab Results  Component Value Date   TSH 1.49 03/08/2018   Lab Results  Component Value Date   WBC 4.5 03/08/2018   HGB 9.5 (L) 03/08/2018   HCT 30.4 (L) 03/08/2018   MCV 71.1 (L) 03/08/2018   PLT 329.0 03/08/2018   Lab Results  Component Value Date   NA 141 03/08/2018   K 4.5 03/08/2018   CO2 25 03/08/2018   GLUCOSE 100 (H) 03/08/2018   BUN 9 03/08/2018   CREATININE 0.70 03/08/2018   BILITOT 0.4  03/08/2018   ALKPHOS 102 03/08/2018   AST 13 03/08/2018   ALT 11 03/08/2018   PROT 6.8 03/08/2018   ALBUMIN 4.2 03/08/2018   CALCIUM 9.1 03/08/2018   GFR 86.77 03/08/2018   Lab Results  Component Value Date   CHOL 197 03/08/2018   Lab Results  Component Value Date   HDL 65.80 03/08/2018   Lab Results  Component Value Date   LDLCALC 114 (H) 03/08/2018   Lab Results  Component Value Date   TRIG 84.0 03/08/2018   Lab Results  Component Value Date   CHOLHDL 3 03/08/2018   No results found for: HGBA1C       Assessment & Plan:   Problem List Items Addressed This Visit    GERD (gastroesophageal reflux disease) - Primary    Has been noting worsening over 20 years and Omeprazole is no longer adequate. Has symptoms daily. Will add the Ranitidine at bedtime and refer to gastroenterology dr Fuller Plan for further consideration. Avoid offending foods, start probiotics. Do not eat large meals in late evening and consider raising head of bed.       Relevant Medications   ranitidine (ZANTAC) 300 MG capsule   Other Relevant Orders   Ambulatory referral to Gastroenterology   Hyperlipidemia    Encouraged heart healthy diet, increase exercise, avoid trans fats, consider a krill oil cap daily      Relevant Orders   Lipid panel (Completed)   Hypertension    Well controlled, no changes to meds. Encouraged heart healthy diet such as the DASH diet and exercise as tolerated.       Relevant Orders   CBC (Completed)   Comprehensive metabolic panel (Completed)  TSH (Completed)   Osteoporosis    Encouraged to get adequate exercise, calcium and vitamin d intake      Anxiety and depression    Has had some infrequent panic attacks and she uses Alprazolam prn with good results. It helps      Relevant Medications   ALPRAZolam (XANAX) 0.25 MG tablet   Cough   Relevant Medications   benzonatate (TESSALON) 100 MG capsule   Other Relevant Orders   DG Chest 2 View (Completed)   Vitamin  D deficiency    Check level       Relevant Orders   VITAMIN D 25 Hydroxy (Vit-D Deficiency, Fractures) (Completed)   Anemia    hgb is lower. Check IFOB and start Hemocyte F. Recheck CBC and watch for any changes      Relevant Medications   ferrous fumarate (HEMOCYTE - 106 MG FE) 325 (106 Fe) MG TABS tablet    Other Visit Diagnoses    Dyspnea, unspecified type       Relevant Orders   ECHOCARDIOGRAM COMPLETE   High risk medication use       Relevant Orders   Pain Mgmt, Profile 8 w/Conf, U (Completed)   Rectal bleeding       Relevant Orders   Fecal occult blood, imunochemical([facility])      I am having Isabella Andersen start on ranitidine, benzonatate, and ferrous fumarate. I am also having her maintain her omeprazole, atorvastatin, risedronate, hydrochlorothiazide, Vitamin D (Ergocalciferol), and ALPRAZolam.  Meds ordered this encounter  Medications  . ranitidine (ZANTAC) 300 MG capsule    Sig: Take 1 capsule (300 mg total) by mouth every evening.    Dispense:  90 capsule    Refill:  3  . benzonatate (TESSALON) 100 MG capsule    Sig: Take 1 capsule (100 mg total) by mouth 2 (two) times daily as needed for cough.    Dispense:  200 capsule    Refill:  3  . DISCONTD: ALPRAZolam (XANAX) 0.25 MG tablet    Sig: Take 1 tablet (0.25 mg total) by mouth 2 (two) times daily as needed for anxiety or sleep.    Dispense:  30 tablet    Refill:  2  . ALPRAZolam (XANAX) 0.25 MG tablet    Sig: Take 1 tablet (0.25 mg total) by mouth 2 (two) times daily as needed for anxiety or sleep.    Dispense:  30 tablet    Refill:  2  . ferrous fumarate (HEMOCYTE - 106 MG FE) 325 (106 Fe) MG TABS tablet    Sig: Take 1 tablet (106 mg of iron total) by mouth daily.    Dispense:  30 each    Refill:  3    CMA served as scribe during this visit. History, Physical and Plan performed by medical provider. Documentation and orders reviewed and attested to.  [name], MD

## 2018-03-08 NOTE — Assessment & Plan Note (Signed)
Encouraged heart healthy diet, increase exercise, avoid trans fats, consider a krill oil cap daily 

## 2018-03-09 ENCOUNTER — Other Ambulatory Visit: Payer: Self-pay

## 2018-03-09 LAB — PAIN MGMT, PROFILE 8 W/CONF, U
6 ACETYLMORPHINE: NEGATIVE ng/mL (ref <10)
ALCOHOL METABOLITES: NEGATIVE ng/mL (ref <500)
Amphetamines: NEGATIVE ng/mL (ref <500)
BENZODIAZEPINES: NEGATIVE ng/mL (ref <100)
Buprenorphine, Urine: NEGATIVE ng/mL (ref <5)
COCAINE METABOLITE: NEGATIVE ng/mL (ref <150)
CREATININE: 57.8 mg/dL
MARIJUANA METABOLITE: NEGATIVE ng/mL (ref <20)
MDMA: NEGATIVE ng/mL (ref <500)
OPIATES: NEGATIVE ng/mL (ref <100)
Oxidant: NEGATIVE ug/mL (ref <200)
Oxycodone: NEGATIVE ng/mL (ref <100)
PH: 6.57 (ref 4.5–9.0)

## 2018-03-09 MED ORDER — AMOXICILLIN-POT CLAVULANATE 875-125 MG PO TABS: 1.0000 | ORAL_TABLET | Freq: Two times a day (BID) | ORAL | 0 refills | Status: DC

## 2018-03-10 MED ORDER — FERROUS FUMARATE 325 (106 FE) MG PO TABS: 1.0000 | ORAL_TABLET | Freq: Every day | ORAL | 3 refills | Status: DC

## 2018-03-15 DIAGNOSIS — D509 Iron deficiency anemia, unspecified: Secondary | ICD-10-CM | POA: Insufficient documentation

## 2018-03-15 DIAGNOSIS — D649 Anemia, unspecified: Secondary | ICD-10-CM

## 2018-03-15 NOTE — Assessment & Plan Note (Signed)
hgb is lower. Check IFOB and start Hemocyte F. Recheck CBC and watch for any changes

## 2018-03-15 NOTE — Assessment & Plan Note (Signed)
Encouraged to get adequate exercise, calcium and vitamin d intake 

## 2018-03-18 ENCOUNTER — Other Ambulatory Visit: Payer: Self-pay | Admitting: Family Medicine

## 2018-03-18 ENCOUNTER — Telehealth: Payer: Self-pay

## 2018-03-18 DIAGNOSIS — F419 Anxiety disorder, unspecified: Principal | ICD-10-CM

## 2018-03-18 DIAGNOSIS — I10 Essential (primary) hypertension: Secondary | ICD-10-CM

## 2018-03-18 DIAGNOSIS — M810 Age-related osteoporosis without current pathological fracture: Secondary | ICD-10-CM

## 2018-03-18 DIAGNOSIS — F329 Major depressive disorder, single episode, unspecified: Secondary | ICD-10-CM

## 2018-03-18 DIAGNOSIS — K219 Gastro-esophageal reflux disease without esophagitis: Secondary | ICD-10-CM

## 2018-03-18 DIAGNOSIS — E785 Hyperlipidemia, unspecified: Secondary | ICD-10-CM

## 2018-03-18 NOTE — Telephone Encounter (Signed)
PA initiated via Covermymeds; KEY: [id number]. Awaiting determination.

## 2018-03-19 NOTE — Telephone Encounter (Signed)
PA approved for 45 tabs per 30 days. Effective 11/24/2017 through 11/23/2018.

## 2018-03-23 ENCOUNTER — Other Ambulatory Visit: Payer: Self-pay | Admitting: *Deleted

## 2018-03-23 DIAGNOSIS — D649 Anemia, unspecified: Secondary | ICD-10-CM

## 2018-03-23 DIAGNOSIS — D509 Iron deficiency anemia, unspecified: Secondary | ICD-10-CM

## 2018-03-23 DIAGNOSIS — K921 Melena: Secondary | ICD-10-CM

## 2018-03-24 ENCOUNTER — Other Ambulatory Visit (INDEPENDENT_AMBULATORY_CARE_PROVIDER_SITE_OTHER): Payer: Medicare Other

## 2018-03-24 ENCOUNTER — Encounter: Payer: Self-pay | Admitting: *Deleted

## 2018-03-24 DIAGNOSIS — D649 Anemia, unspecified: Secondary | ICD-10-CM

## 2018-03-24 LAB — RETICULOCYTES
ABS Retic: 61050 cells/uL (ref 20000–8000)
Retic Ct Pct: 1.5 %

## 2018-03-24 LAB — FERRITIN: FERRITIN: 5.6 ng/mL — AB (ref 10.0–291.0)

## 2018-03-24 LAB — CBC
HEMATOCRIT: 29 % — AB (ref 36.0–46.0)
Hemoglobin: 9.2 g/dL — ABNORMAL LOW (ref 12.0–15.0)
MCHC: 31.7 g/dL (ref 30.0–36.0)
MCV: 70.7 fl — AB (ref 78.0–100.0)
Platelets: 323 10*3/uL (ref 150.0–400.0)
RBC: 4.1 Mil/uL (ref 3.87–5.11)
RDW: 17.8 % — AB (ref 11.5–15.5)
WBC: 5.3 10*3/uL (ref 4.0–10.5)

## 2018-03-26 NOTE — Addendum Note (Signed)
Addended by: Magdalene Molly A on: 03/26/2018 09:17 AM   Modules accepted: Orders

## 2018-03-26 NOTE — Progress Notes (Signed)
ref

## 2018-03-29 ENCOUNTER — Ambulatory Visit (INDEPENDENT_AMBULATORY_CARE_PROVIDER_SITE_OTHER): Payer: Medicare Other | Admitting: Family Medicine

## 2018-03-29 VITALS — BP 140/76 | HR 64 | Temp 98.2°F | Resp 18 | Wt 168.2 lb

## 2018-03-29 DIAGNOSIS — R05 Cough: Secondary | ICD-10-CM | POA: Diagnosis not present

## 2018-03-29 DIAGNOSIS — R079 Chest pain, unspecified: Secondary | ICD-10-CM | POA: Diagnosis not present

## 2018-03-29 DIAGNOSIS — R1013 Epigastric pain: Secondary | ICD-10-CM | POA: Diagnosis not present

## 2018-03-29 DIAGNOSIS — K219 Gastro-esophageal reflux disease without esophagitis: Secondary | ICD-10-CM

## 2018-03-29 DIAGNOSIS — D649 Anemia, unspecified: Secondary | ICD-10-CM | POA: Diagnosis not present

## 2018-03-29 DIAGNOSIS — I10 Essential (primary) hypertension: Secondary | ICD-10-CM | POA: Diagnosis not present

## 2018-03-29 DIAGNOSIS — R059 Cough, unspecified: Secondary | ICD-10-CM

## 2018-03-29 LAB — FERRITIN: FERRITIN: 4.3 ng/mL — AB (ref 10.0–291.0)

## 2018-03-29 LAB — CBC
HEMATOCRIT: 30.6 % — AB (ref 36.0–46.0)
Hemoglobin: 9.6 g/dL — ABNORMAL LOW (ref 12.0–15.0)
MCHC: 31.5 g/dL (ref 30.0–36.0)
Platelets: 369 10*3/uL (ref 150.0–400.0)
RBC: 4.38 Mil/uL (ref 3.87–5.11)
RDW: 17.5 % — AB (ref 11.5–15.5)
WBC: 5.4 10*3/uL (ref 4.0–10.5)

## 2018-03-29 LAB — COMPREHENSIVE METABOLIC PANEL
ALBUMIN: 4.4 g/dL (ref 3.5–5.2)
ALT: 15 U/L (ref 0–35)
AST: 19 U/L (ref 0–37)
Alkaline Phosphatase: 107 U/L (ref 39–117)
BUN: 11 mg/dL (ref 6–23)
CO2: 28 meq/L (ref 19–32)
Calcium: 9.4 mg/dL (ref 8.4–10.5)
Chloride: 102 mEq/L (ref 96–112)
Creatinine, Ser: 0.68 mg/dL (ref 0.40–1.20)
GFR: 89.7 mL/min (ref >60.00)
Glucose, Bld: 96 mg/dL (ref 70–99)
POTASSIUM: 4.1 meq/L (ref 3.5–5.1)
SODIUM: 139 meq/L (ref 135–145)
Total Bilirubin: 0.4 mg/dL (ref 0.2–1.2)
Total Protein: 7 g/dL (ref 6.0–8.3)

## 2018-03-29 LAB — H. PYLORI ANTIBODY, IGG: H Pylori IgG: NEGATIVE

## 2018-03-29 LAB — IRON: Iron: 382 ug/dL — ABNORMAL HIGH (ref 42–145)

## 2018-03-29 MED ORDER — OMEPRAZOLE 40 MG PO CPDR: 40.0000 mg | DELAYED_RELEASE_CAPSULE | Freq: Every day | ORAL | 1 refills | Status: DC

## 2018-03-29 NOTE — Assessment & Plan Note (Signed)
Avoid offending foods, start probiotics. Do not eat large meals in late evening and consider raising head of bed.  

## 2018-03-29 NOTE — Patient Instructions (Signed)
Hold the Actonel until you are scoped by gastroenterology and they can see what your stomach looks like Anemia Anemia is a condition in which you do not have enough red blood cells or hemoglobin. Hemoglobin is a substance in red blood cells that carries oxygen. When you do not have enough red blood cells or hemoglobin (are anemic), your body cannot get enough oxygen and your organs may not work properly. As a result, you may feel very tired or have other problems. What are the causes? Common causes of anemia include:  Excessive bleeding. Anemia can be caused by excessive bleeding inside or outside the body, including bleeding from the intestine or from periods in women.  Poor nutrition.  Long-lasting (chronic) kidney, thyroid, and liver disease.  Bone marrow disorders.  Cancer and treatments for cancer.  HIV (human immunodeficiency virus) and AIDS (acquired immunodeficiency syndrome).  Treatments for HIV and AIDS.  Spleen problems.  Blood disorders.  Infections, medicines, and autoimmune disorders that destroy red blood cells.  What are the signs or symptoms? Symptoms of this condition include:  Minor weakness.  Dizziness.  Headache.  Feeling heartbeats that are irregular or faster than normal (palpitations).  Shortness of breath, especially with exercise.  Paleness.  Cold sensitivity.  Indigestion.  Nausea.  Difficulty sleeping.  Difficulty concentrating.  Symptoms may occur suddenly or develop slowly. If your anemia is mild, you may not have symptoms. How is this diagnosed? This condition is diagnosed based on:  Blood tests.  Your medical history.  A physical exam.  Bone marrow biopsy.  Your health care provider may also check your stool (feces) for blood and may do additional testing to look for the cause of your bleeding. You may also have other tests, including:  Imaging tests, such as a CT scan or MRI.  Endoscopy.  Colonoscopy.  How is  this treated? Treatment for this condition depends on the cause. If you continue to lose a lot of blood, you may need to be treated at a hospital. Treatment may include:  Taking supplements of iron, vitamin U04, or folic acid.  Taking a hormone medicine (erythropoietin) that can help to stimulate red blood cell growth.  Having a blood transfusion. This may be needed if you lose a lot of blood.  Making changes to your diet.  Having surgery to remove your spleen.  Follow these instructions at home:  Take over-the-counter and prescription medicines only as told by your health care provider.  Take supplements only as told by your health care provider.  Follow any diet instructions that you were given.  Keep all follow-up visits as told by your health care provider. This is important. Contact a health care provider if:  You develop new bleeding anywhere in the body. Get help right away if:  You are very weak.  You are short of breath.  You have pain in your abdomen or chest.  You are dizzy or feel faint.  You have trouble concentrating.  You have bloody or black, tarry stools.  You vomit repeatedly or you vomit up blood. Summary  Anemia is a condition in which you do not have enough red blood cells or enough of a substance in your red blood cells that carries oxygen (hemoglobin).  Symptoms may occur suddenly or develop slowly.  If your anemia is mild, you may not have symptoms.  This condition is diagnosed with blood tests as well as a medical history and physical exam. Other tests may be needed.  Treatment  for this condition depends on the cause of the anemia. This information is not intended to replace advice given to you by your health care provider. Make sure you discuss any questions you have with your health care provider. Document Released: 12/18/2004 Document Revised: 12/12/2016 Document Reviewed: 12/12/2016 Elsevier Interactive Patient Education  [employer].

## 2018-03-29 NOTE — Assessment & Plan Note (Signed)
Right lower anterior vs RUQ pain. Associated with heartburn. It has only occurred once if it continues and/or worsens with associated symptoms of SOB/palp/etc she will seek care and/or let us know.

## 2018-03-29 NOTE — Assessment & Plan Note (Signed)
Persistent and likely due to acid. Throat erythematous. Will increase the Omeprazole to 40, continue the Ranitidine and continue the probiotic she has started

## 2018-03-29 NOTE — Assessment & Plan Note (Signed)
hgb worsening has started on Hemocyte. She is experiencing epigastric pain and episodes of diaphoresis with pain sometimes after eating. Stop Actonel. Check H Pylori, increase Omeprazole to 40 mg in am and continue to Ranitidine qhs. Has appt with GI upper this month due to persistent symptoms. She will seek care if worsens. Her hemoccult in office was negative but she describes black stool starting before the Hemocyte and occurring daily so she will complete the 3 cards she has at home.

## 2018-03-29 NOTE — Assessment & Plan Note (Signed)
Well controlled, no changes to meds. Encouraged heart healthy diet such as the DASH diet and exercise as tolerated.  °

## 2018-03-29 NOTE — Progress Notes (Signed)
Subjective:  I acted as a Education administrator for Dr. Charlett Blake. [name], [place]  Patient ID: Isabella Andersen, female    DOB: 1941-02-13, 77 y.o.   MRN: [id number]  No chief complaint on file.   HPI  Patient is in today for follow up on complaint of daily episodes of black stool, no bright red blood or acute abdominal pain. No fevers or chills. Denies CP/palp/SOB/HA/congestion/fevers/GI or GU c/o. Taking meds as prescribed. Also notes increased pedal edema and fatigue levels in the afternoon the past 3 years. Denies CP/palp/SOB/HA/congestion/fevers/GI or GU c/o. Taking meds as prescribed  Patient Care Team: Mosie Lukes, MD as PCP - General (Family Medicine) Druscilla Brownie, MD as Consulting Physician (Dermatology)   Past Medical History:  Diagnosis Date  . Anxiety   . Cataract   . GERD (gastroesophageal reflux disease)   . H/O measles   . H/O mumps   . Heart murmur   . History of chicken pox   . Hyperlipidemia   . Hypertension   . Left shoulder pain 09/07/2017  . Osteoporosis   . Shingles   . Vitamin D deficiency 02/03/2016    Past Surgical History:  Procedure Laterality Date  . ABDOMINAL HYSTERECTOMY  1972  . APPENDECTOMY    . BLEPHAROPLASTY  2015  . CATARACT EXTRACTION     both eyes  . COLONOSCOPY    . EYE SURGERY Bilateral 2013   b/l cataracts, By Dr Dolores Lory at [place]  . FOOT SURGERY    . TONSILLECTOMY AND ADENOIDECTOMY      Family History  Problem Relation Age of Onset  . Colon cancer Mother        died of colon ca  . Cancer Mother        breast, colon cancer  . Colon polyps Sister   . Colon polyps Sister   . Heart disease Father        died of heart attack  . Heart disease Maternal Grandmother   . Heart disease Maternal Grandfather   . Cancer Maternal Aunt        breast  . Cancer Maternal Aunt        breast  . Heart disease Brother        MI  . Asthma Paternal Uncle   . Esophageal cancer Neg Hx   . Rectal cancer Neg Hx   . Stomach cancer Neg Hx     Social  History   Socioeconomic History  . Marital status: Married    Spouse name: Not on file  . Number of children: Not on file  . Years of education: Not on file  . Highest education level: Not on file  Occupational History  . Not on file  Social Needs  . Financial resource strain: Not on file  . Food insecurity:    Worry: Not on file    Inability: Not on file  . Transportation needs:    Medical: Not on file    Non-medical: Not on file  Tobacco Use  . Smoking status: Never Smoker  . Smokeless tobacco: Never Used  Substance and Sexual Activity  . Alcohol use: No    Alcohol/week: 0.0 oz  . Drug use: No  . 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 Physical activity:    Days per week: Not on file    Minutes per session: Not on file  . Stress: Not on file  Relationships  . Social connections:    Talks on phone: Not on file    Gets together: Not on file    Attends religious service: Not on file    Active member of club or organization: Not on file    Attends meetings of clubs or organizations: Not on file    Relationship status: Not on file  . Intimate partner violence:    Fear of current or ex partner: Not on file    Emotionally abused: Not on file    Physically abused: Not on file    Forced sexual activity: Not on file  Other Topics Concern  . Not on file  Social History Narrative  . Not on file    Outpatient Medications Prior to Visit  Medication Sig Dispense Refill  . ALPRAZolam (XANAX) 0.25 MG tablet Take 1 tablet (0.25 mg total) by mouth 2 (two) times daily as needed for anxiety or sleep. 30 tablet 2  . amoxicillin-clavulanate (AUGMENTIN) 875-125 MG tablet Take 1 tablet by mouth 2 (two) times daily. 14 tablet 0  . atorvastatin (LIPITOR) 40 MG tablet TAKE 1 AND 1/2 TABLETS DAILY. 135 tablet 1  . benzonatate (TESSALON) 100 MG capsule Take 1 capsule (100 mg total) by mouth 2 (two) times  daily as needed for cough. 200 capsule 3  . ferrous fumarate (HEMOCYTE - 106 MG FE) 325 (106 Fe) MG TABS tablet Take 1 tablet (106 mg of iron total) by mouth daily. 30 each 3  . hydrochlorothiazide (HYDRODIURIL) 25 MG tablet TAKE 1 TABLET EVERY DAY 90 tablet 0  . ranitidine (ZANTAC) 300 MG capsule Take 1 capsule (300 mg total) by mouth every evening. 90 capsule 3  . Vitamin D, Ergocalciferol, (DRISDOL) 50000 units CAPS capsule Take 1 capsule (50,000 Units total) by mouth every 7 (seven) days. 4 capsule 4  . omeprazole (PRILOSEC) 40 MG capsule Take 1 capsule (40 mg total) by mouth daily. 90 capsule 3  . risedronate (ACTONEL) 35 MG tablet Take 1 tablet (35 mg total) by mouth every 7 (seven) days. with water on empty stomach, nothing by mouth or lie down for next 30 minutes. 4 tablet 5   No facility-administered medications prior to visit.     No Known Allergies  Review of Systems  Constitutional: Positive for malaise/fatigue. Negative for fever.  HENT: Negative for congestion.   Eyes: Negative for blurred vision.  Respiratory: Positive for cough and shortness of breath.   Cardiovascular: Positive for chest pain. Negative for palpitations and leg swelling.  Gastrointestinal: Positive for abdominal pain, heartburn, melena and nausea. Negative for blood in stool, constipation, diarrhea and vomiting.  Genitourinary: Negative for dysuria and frequency.  Musculoskeletal: Negative for falls.  Skin: Negative for rash.  Neurological: Negative for dizziness, loss of consciousness and headaches.  Endo/Heme/Allergies: Negative for environmental allergies.  Psychiatric/Behavioral: Negative for depression.       Objective:    Physical Exam  Constitutional: She is oriented to person, place, and time. She appears well-developed and well-nourished. No distress.  HENT:  Head: Normocephalic and atraumatic.  Nose: Nose normal.  Pale conjunctivae   Eyes: Right eye exhibits no discharge. Left eye  exhibits no discharge.  Neck: Normal range of motion. Neck supple.  Cardiovascular: Normal rate and regular rhythm.  No murmur heard. Pulmonary/Chest: Effort normal and breath sounds normal.  Abdominal: Soft. Bowel sounds are normal. There is no tenderness.  Genitourinary:  Genitourinary Comments: Heme negative on rectal exam. No lesions noted. Good tone. Brown stool  Musculoskeletal: She exhibits no edema.  Neurological: She is alert and oriented to person, place, and time.  Skin: Skin is warm and dry.  Psychiatric: She has a normal mood and affect.  Nursing note and vitals reviewed.   BP 140/76 (BP Location: Left Arm, Patient Position: Sitting, Cuff Size: Normal)   Pulse 64   Temp 98.2 F (36.8 C) (Oral)   Resp 18   Wt 168 lb 3.2 oz (76.3 kg)   SpO2 98%   BMI 28.87 kg/m  Wt Readings from Last 3 Encounters:  03/29/18 168 lb 3.2 oz (76.3 kg)  03/08/18 170 lb 12.8 oz (77.5 kg)  09/07/17 167 lb 3.2 oz (75.8 kg)   BP Readings from Last 3 Encounters:  03/29/18 140/76  03/08/18 140/78  09/07/17 140/62     Immunization History  Administered Date(s) Administered  . Influenza Split 09/09/2011  . Influenza, High Dose Seasonal PF 08/28/2016, 09/07/2017  . Influenza, Seasonal, Injecte, Preservative Fre 10/26/2012  . Influenza,inj,Quad PF,6+ Mos 08/29/2013  . Influenza-Unspecified 09/14/2014, 10/25/2015  . Pneumococcal Conjugate-13 12/22/2014  . Pneumococcal Polysaccharide-23 11/25/2007, 01/24/2016  . Tdap 11/25/2007    Health Maintenance  Topic Date Due  . TETANUS/TDAP  11/24/2017  . INFLUENZA VACCINE  06/24/2018  . MAMMOGRAM  03/03/2019  . COLONOSCOPY  03/27/2021  . DEXA SCAN  Completed  . PNA vac Low Risk Adult  Completed    Lab Results  Component Value Date   WBC 5.4 03/29/2018   HGB 9.6 (L) 03/29/2018   HCT 30.6 (L) 03/29/2018   PLT 369.0 03/29/2018   GLUCOSE 96 03/29/2018   CHOL 197 03/08/2018   TRIG 84.0 03/08/2018   HDL 65.80 03/08/2018   LDLDIRECT 120.2  07/14/2012   LDLCALC 114 (H) 03/08/2018   ALT 15 03/29/2018   AST 19 03/29/2018   NA 139 03/29/2018   K 4.1 03/29/2018   CL 102 03/29/2018   CREATININE 0.68 03/29/2018   BUN 11 03/29/2018   CO2 28 03/29/2018   TSH 1.49 03/08/2018    Lab Results  Component Value Date   TSH 1.49 03/08/2018   Lab Results  Component Value Date   WBC 5.4 03/29/2018   HGB 9.6 (L) 03/29/2018   HCT 30.6 (L) 03/29/2018   MCV 69.9 Repeated and verified X2. (L) 03/29/2018   PLT 369.0 03/29/2018   Lab Results  Component Value Date   NA 139 03/29/2018   K 4.1 03/29/2018   CO2 28 03/29/2018   GLUCOSE 96 03/29/2018   BUN 11 03/29/2018   CREATININE 0.68 03/29/2018   BILITOT 0.4 03/29/2018   ALKPHOS 107 03/29/2018   AST 19 03/29/2018   ALT 15 03/29/2018   PROT 7.0 03/29/2018   ALBUMIN 4.4 03/29/2018   CALCIUM 9.4 03/29/2018   GFR 89.70 03/29/2018   Lab Results  Component Value Date   CHOL 197 03/08/2018   Lab Results  Component Value Date   HDL 65.80 03/08/2018   Lab Results  Component Value Date   LDLCALC 114 (H) 03/08/2018   Lab Results  Component Value Date   TRIG 84.0 03/08/2018   Lab Results  Component Value Date   CHOLHDL 3 03/08/2018   No results found for: HGBA1C       Assessment & Plan:   Problem List Items Addressed This Visit    GERD (gastroesophageal reflux disease)    Avoid offending foods, start probiotics. Do not eat large meals in late evening and consider raising head of bed.  Relevant Medications   omeprazole (PRILOSEC) 40 MG capsule   Hypertension    Well controlled, no changes to meds. Encouraged heart healthy diet such as the DASH diet and exercise as tolerated.       Chest pain    Right lower anterior vs RUQ pain. Associated with heartburn. It has only occurred once if it continues and/or worsens with associated symptoms of SOB/palp/etc she will seek care and/or let us know.       Cough    Persistent and likely due to acid. Throat  erythematous. Will increase the Omeprazole to 40, continue the Ranitidine and continue the probiotic she has started      Anemia    hgb worsening has started on Hemocyte. She is experiencing epigastric pain and episodes of diaphoresis with pain sometimes after eating. Stop Actonel. Check H Pylori, increase Omeprazole to 40 mg in am and continue to Ranitidine qhs. Has appt with GI upper this month due to persistent symptoms. She will seek care if worsens. Her hemoccult in office was negative but she describes black stool starting before the Hemocyte and occurring daily so she will complete the 3 cards she has at home.       Relevant Orders   CBC (Completed)   Comprehensive metabolic panel (Completed)   Ferritin (Completed)   Iron (Completed)   H. pylori antibody, IgG (Completed)    Other Visit Diagnoses    Epigastric pain    -  Primary   Relevant Orders   US Abdomen Complete   CBC (Completed)   Comprehensive metabolic panel (Completed)   Ferritin (Completed)   Iron (Completed)   H. pylori antibody, IgG (Completed)      I have discontinued Gabrille W. Dimmick's risedronate. I am also having her maintain her hydrochlorothiazide, Vitamin D (Ergocalciferol), ranitidine, benzonatate, ALPRAZolam, amoxicillin-clavulanate, ferrous fumarate, atorvastatin, and omeprazole.  Meds ordered this encounter  Medications  . omeprazole (PRILOSEC) 40 MG capsule    Sig: Take 1 capsule (40 mg total) by mouth daily.    Dispense:  90 capsule    Refill:  1    CMA served as scribe during this visit. History, Physical and Plan performed by medical provider. Documentation and orders reviewed and attested to.  [name], MD

## 2018-03-31 ENCOUNTER — Ambulatory Visit (HOSPITAL_BASED_OUTPATIENT_CLINIC_OR_DEPARTMENT_OTHER)
Admission: RE | Admit: 2018-03-31 | Discharge: 2018-03-31 | Disposition: A | Discharge status: Home / Self Care | Payer: Medicare Other | Source: Ambulatory Visit | Attending: Family Medicine | Admitting: Family Medicine

## 2018-03-31 DIAGNOSIS — I340 Nonrheumatic mitral (valve) insufficiency: Secondary | ICD-10-CM | POA: Diagnosis not present

## 2018-03-31 DIAGNOSIS — R011 Cardiac murmur, unspecified: Secondary | ICD-10-CM | POA: Insufficient documentation

## 2018-03-31 DIAGNOSIS — K760 Fatty (change of) liver, not elsewhere classified: Secondary | ICD-10-CM | POA: Diagnosis not present

## 2018-03-31 DIAGNOSIS — I10 Essential (primary) hypertension: Secondary | ICD-10-CM | POA: Insufficient documentation

## 2018-03-31 DIAGNOSIS — R1013 Epigastric pain: Secondary | ICD-10-CM | POA: Insufficient documentation

## 2018-03-31 DIAGNOSIS — E785 Hyperlipidemia, unspecified: Secondary | ICD-10-CM | POA: Diagnosis not present

## 2018-03-31 DIAGNOSIS — R0600 Dyspnea, unspecified: Secondary | ICD-10-CM | POA: Diagnosis not present

## 2018-03-31 NOTE — Progress Notes (Signed)
  Echocardiogram 2D Echocardiogram has been performed.  Isabella Andersen Isabella Andersen 03/31/2018, 3:04 PM

## 2018-04-13 ENCOUNTER — Encounter: Payer: Self-pay | Admitting: Gastroenterology

## 2018-04-13 ENCOUNTER — Other Ambulatory Visit: Payer: Self-pay

## 2018-04-13 ENCOUNTER — Other Ambulatory Visit (INDEPENDENT_AMBULATORY_CARE_PROVIDER_SITE_OTHER): Payer: Medicare Other

## 2018-04-13 ENCOUNTER — Ambulatory Visit (INDEPENDENT_AMBULATORY_CARE_PROVIDER_SITE_OTHER): Payer: Medicare Other | Admitting: Gastroenterology

## 2018-04-13 VITALS — BP 136/74 | HR 84 | Ht 62.75 in | Wt 171.2 lb

## 2018-04-13 DIAGNOSIS — K219 Gastro-esophageal reflux disease without esophagitis: Secondary | ICD-10-CM

## 2018-04-13 DIAGNOSIS — D509 Iron deficiency anemia, unspecified: Secondary | ICD-10-CM

## 2018-04-13 LAB — CBC WITH DIFFERENTIAL/PLATELET
Basophils Absolute: 0 10*3/uL (ref 0.0–0.1)
Basophils Relative: 0.7 % (ref 0.0–3.0)
Eosinophils Absolute: 0.1 10*3/uL (ref 0.0–0.7)
Eosinophils Relative: 2.4 % (ref 0.0–5.0)
HEMATOCRIT: 29.5 % — AB (ref 36.0–46.0)
HEMOGLOBIN: 9.3 g/dL — AB (ref 12.0–15.0)
LYMPHS ABS: 1.5 10*3/uL (ref 0.7–4.0)
LYMPHS PCT: 29.6 % (ref 12.0–46.0)
MCHC: 31.6 g/dL (ref 30.0–36.0)
MCV: 71 fl — AB (ref 78.0–100.0)
Monocytes Absolute: 0.5 10*3/uL (ref 0.1–1.0)
Monocytes Relative: 9.5 % (ref 3.0–12.0)
NEUTROS ABS: 3 10*3/uL (ref 1.4–7.7)
Neutrophils Relative %: 57.8 % (ref 43.0–77.0)
Platelets: 305 10*3/uL (ref 150.0–400.0)
RBC: 4.16 Mil/uL (ref 3.87–5.11)
RDW: 18.9 % — AB (ref 11.5–15.5)
WBC: 5.1 10*3/uL (ref 4.0–10.5)

## 2018-04-13 LAB — IGA: IgA: 72 mg/dL (ref 68–378)

## 2018-04-13 MED ORDER — VITAMIN D (ERGOCALCIFEROL) 1.25 MG (50000 UNIT) PO CAPS: 50000.0000 [IU] | ORAL_CAPSULE | ORAL | 4 refills | Status: AC

## 2018-04-13 NOTE — Patient Instructions (Signed)
Your provider has requested that you go to the basement level for lab work before leaving today. Press "B" on the elevator. The lab is located at the first door on the left as you exit the elevator.  You have been scheduled for an endoscopy. Please follow written instructions given to you at your visit today. If you use inhalers (even only as needed), please bring them with you on the day of your procedure. Your physician has requested that you go to www.startemmi.com and enter the access code given to you at your visit today. This web site gives a general overview about your procedure. However, you should still follow specific instructions given to you by our office regarding your preparation for the procedure.  Normal BMI (Body Mass Index- based on height and weight) is between 23 and 30. Your BMI today is Body mass index is 30.58 kg/m. Marland Kitchen Please consider follow up  regarding your BMI with your Primary Care Provider.  Thank you for choosing me and [facility].  Pricilla Riffle. Dagoberto Ligas., MD., Marval Regal

## 2018-04-13 NOTE — Progress Notes (Signed)
    History of Present Illness: This is a 77 year old female with reflux symptoms and iron deficiency anemia.  She relates several months of frequent heartburn and regurgitation.  She relates problems with reflux and regurgitation for about 15 to 20 years.  Iron deficiency anemia was recently diagnosed with Hb=9.6, MCV=69.9, ferritin of 4.3.  Iron=382.  Stool heme negative at [facility] and home stool Hemoccult results are pending.  Her omeprazole dosing was increased about 2 weeks ago and her symptoms have substantially improved.  Abdominal ultrasound results below.  Abd [place] 03/31/2017 IMPRESSION: Liver appears slightly enlarged with mild fatty infiltration. Mild renal cortical thinning bilaterally. Otherwise negative exam.  Colonoscopy 03/2016: normal   Current Medications, Allergies, Past Medical History, Past Surgical History, Family History and Social History were reviewed in [employer] record.  Physical Exam: General: Well developed, well nourished, no acute distress Head: Normocephalic and atraumatic Eyes:  sclerae anicteric, EOMI Ears: Normal auditory acuity Mouth: No deformity or lesions Lungs: Clear throughout to auscultation Heart: Regular rate and rhythm; no murmurs, rubs or bruits Abdomen: Soft, non tender and non distended. No masses, hepatosplenomegaly or hernias noted. Normal Bowel sounds Rectal: Recent exam by Dr. Charlett Blake. No lesions, heme negative stool Musculoskeletal: Symmetrical with no gross deformities  Pulses:  Normal pulses noted Extremities: No clubbing, cyanosis, edema or deformities noted Neurological: Alert oriented x 4, grossly nonfocal Psychological:  Alert and cooperative. Normal mood and affect  Assessment and Recommendations:  1. GERD. R/O esophagitis, ulcer. Continue omeprazole 40 mg po qam and ranitidine 300 mg hs. Closely follow antireflux measures.  Schedule EGD. The risks (including bleeding, perforation, infection, missed lesions,  medication reactions and possible hospitalization or surgery if complications occur), benefits, and alternatives to endoscopy with possible biopsy and possible dilation were discussed with the patient and they consent to proceed.   2. Iron deficiency anemia. CBC, IgA, tTG today.  She has completed 1 month of iron therapy.  If microcytic anemia persists will continue iron replacement for another 1 to 2 months.  EGD as above.  Colonoscopy 2 years ago was normal.

## 2018-04-14 LAB — TISSUE TRANSGLUTAMINASE, IGA: (TTG) AB, IGA: 1 U/mL

## 2018-06-08 ENCOUNTER — Encounter: Payer: Self-pay | Admitting: Family Medicine

## 2018-06-08 ENCOUNTER — Ambulatory Visit (INDEPENDENT_AMBULATORY_CARE_PROVIDER_SITE_OTHER): Payer: Medicare Other | Admitting: Family Medicine

## 2018-06-08 VITALS — BP 142/80 | HR 82 | Temp 98.2°F | Resp 18 | Wt 169.2 lb

## 2018-06-08 DIAGNOSIS — K219 Gastro-esophageal reflux disease without esophagitis: Secondary | ICD-10-CM

## 2018-06-08 DIAGNOSIS — I10 Essential (primary) hypertension: Secondary | ICD-10-CM | POA: Diagnosis not present

## 2018-06-08 DIAGNOSIS — Z8619 Personal history of other infectious and parasitic diseases: Secondary | ICD-10-CM

## 2018-06-08 DIAGNOSIS — D509 Iron deficiency anemia, unspecified: Secondary | ICD-10-CM

## 2018-06-08 DIAGNOSIS — E782 Mixed hyperlipidemia: Secondary | ICD-10-CM

## 2018-06-08 DIAGNOSIS — M810 Age-related osteoporosis without current pathological fracture: Secondary | ICD-10-CM

## 2018-06-08 DIAGNOSIS — E559 Vitamin D deficiency, unspecified: Secondary | ICD-10-CM | POA: Diagnosis not present

## 2018-06-08 LAB — FERRITIN: FERRITIN: 12.9 ng/mL (ref 10.0–291.0)

## 2018-06-08 LAB — CBC WITH DIFFERENTIAL/PLATELET
BASOS ABS: 0 10*3/uL (ref 0.0–0.1)
Basophils Relative: 0.6 % (ref 0.0–3.0)
EOS ABS: 0.1 10*3/uL (ref 0.0–0.7)
Eosinophils Relative: 1.5 % (ref 0.0–5.0)
HEMATOCRIT: 36.8 % (ref 36.0–46.0)
HEMOGLOBIN: 11.9 g/dL — AB (ref 12.0–15.0)
LYMPHS PCT: 22.4 % (ref 12.0–46.0)
Lymphs Abs: 1.3 10*3/uL (ref 0.7–4.0)
MCHC: 32.3 g/dL (ref 30.0–36.0)
MCV: 76.1 fl — ABNORMAL LOW (ref 78.0–100.0)
MONO ABS: 0.5 10*3/uL (ref 0.1–1.0)
Monocytes Relative: 8.1 % (ref 3.0–12.0)
Neutro Abs: 3.9 10*3/uL (ref 1.4–7.7)
Neutrophils Relative %: 67.4 % (ref 43.0–77.0)
Platelets: 311 10*3/uL (ref 150.0–400.0)
RBC: 4.84 Mil/uL (ref 3.87–5.11)
RDW: 23.1 % — ABNORMAL HIGH (ref 11.5–15.5)
WBC: 5.8 10*3/uL (ref 4.0–10.5)

## 2018-06-08 LAB — COMPREHENSIVE METABOLIC PANEL
ALBUMIN: 4.6 g/dL (ref 3.5–5.2)
ALT: 19 U/L (ref 0–35)
AST: 18 U/L (ref 0–37)
Alkaline Phosphatase: 106 U/L (ref 39–117)
BILIRUBIN TOTAL: 0.3 mg/dL (ref 0.2–1.2)
BUN: 7 mg/dL (ref 6–23)
CALCIUM: 9.5 mg/dL (ref 8.4–10.5)
CO2: 29 mEq/L (ref 19–32)
CREATININE: 0.72 mg/dL (ref 0.40–1.20)
Chloride: 105 mEq/L (ref 96–112)
GFR: 83.93 mL/min (ref >60.00)
Glucose, Bld: 95 mg/dL (ref 70–99)
Potassium: 4.8 mEq/L (ref 3.5–5.1)
SODIUM: 141 meq/L (ref 135–145)
TOTAL PROTEIN: 6.9 g/dL (ref 6.0–8.3)

## 2018-06-08 LAB — VITAMIN D 25 HYDROXY (VIT D DEFICIENCY, FRACTURES): VITD: 40.33 ng/mL (ref 30.00–100.00)

## 2018-06-08 NOTE — Patient Instructions (Signed)
DASH Eating Plan DASH stands for "Dietary Approaches to Stop Hypertension." The DASH eating plan is a healthy eating plan that has been shown to reduce high blood pressure (hypertension). It may also reduce your risk for type 2 diabetes, heart disease, and stroke. The DASH eating plan may also help with weight loss. What are tips for following this plan? General guidelines  Avoid eating more than 2,300 mg (milligrams) of salt (sodium) a day. If you have hypertension, you may need to reduce your sodium intake to 1,500 mg a day.  Limit alcohol intake to no more than 1 drink a day for nonpregnant women and 2 drinks a day for men. One drink equals 12 oz of beer, 5 oz of wine, or 1 oz of hard liquor.  Work with your health care provider to maintain a healthy body weight or to lose weight. Ask what an ideal weight is for you.  Get at least 30 minutes of exercise that causes your heart to beat faster (aerobic exercise) most days of the week. Activities may include walking, swimming, or biking.  Work with your health care provider or diet and nutrition specialist (dietitian) to adjust your eating plan to your individual calorie needs. Reading food labels  Check food labels for the amount of sodium per serving. Choose foods with less than 5 percent of the Daily Value of sodium. Generally, foods with less than 300 mg of sodium per serving fit into this eating plan.  To find whole grains, look for the word "whole" as the first word in the ingredient list. Shopping  Buy products labeled as "low-sodium" or "no salt added."  Buy fresh foods. Avoid canned foods and premade or frozen meals. Cooking  Avoid adding salt when cooking. Use salt-free seasonings or herbs instead of table salt or sea salt. Check with your health care provider or pharmacist before using salt substitutes.  Do not fry foods. Cook foods using healthy methods such as baking, boiling, grilling, and broiling instead.  Cook with  heart-healthy oils, such as olive, canola, soybean, or sunflower oil. Meal planning   Eat a balanced diet that includes: ? 5 or more servings of fruits and vegetables each day. At each meal, try to fill half of your plate with fruits and vegetables. ? Up to 6-8 servings of whole grains each day. ? Less than 6 oz of lean meat, poultry, or fish each day. A 3-oz serving of meat is about the same size as a deck of cards. One egg equals 1 oz. ? 2 servings of low-fat dairy each day. ? A serving of nuts, seeds, or beans 5 times each week. ? Heart-healthy fats. Healthy fats called Omega-3 fatty acids are found in foods such as flaxseeds and coldwater fish, like sardines, salmon, and mackerel.  Limit how much you eat of the following: ? Canned or prepackaged foods. ? Food that is high in trans fat, such as fried foods. ? Food that is high in saturated fat, such as fatty meat. ? Sweets, desserts, sugary drinks, and other foods with added sugar. ? Full-fat dairy products.  Do not salt foods before eating.  Try to eat at least 2 vegetarian meals each week.  Eat more home-cooked food and less restaurant, buffet, and fast food.  When eating at a restaurant, ask that your food be prepared with less salt or no salt, if possible. What foods are recommended? The items listed may not be a complete list. Talk with your dietitian about what   dietary choices are best for you. Grains Whole-grain or whole-wheat bread. Whole-grain or whole-wheat pasta. Brown rice. Oatmeal. Quinoa. Bulgur. Whole-grain and low-sodium cereals. Pita bread. Low-fat, low-sodium crackers. Whole-wheat flour tortillas. Vegetables Fresh or frozen vegetables (raw, steamed, roasted, or grilled). Low-sodium or reduced-sodium tomato and vegetable juice. Low-sodium or reduced-sodium tomato sauce and tomato paste. Low-sodium or reduced-sodium canned vegetables. Fruits All fresh, dried, or frozen fruit. Canned fruit in natural juice (without  added sugar). Meat and other protein foods Skinless chicken or turkey. Ground chicken or turkey. Pork with fat trimmed off. Fish and seafood. Egg whites. Dried beans, peas, or lentils. Unsalted nuts, nut butters, and seeds. Unsalted canned beans. Lean cuts of beef with fat trimmed off. Low-sodium, lean deli meat. Dairy Low-fat (1%) or fat-free (skim) milk. Fat-free, low-fat, or reduced-fat cheeses. Nonfat, low-sodium ricotta or cottage cheese. Low-fat or nonfat yogurt. Low-fat, low-sodium cheese. Fats and oils Soft margarine without trans fats. Vegetable oil. Low-fat, reduced-fat, or light mayonnaise and salad dressings (reduced-sodium). Canola, safflower, olive, soybean, and sunflower oils. Avocado. Seasoning and other foods Herbs. Spices. Seasoning mixes without salt. Unsalted popcorn and pretzels. Fat-free sweets. What foods are not recommended? The items listed may not be a complete list. Talk with your dietitian about what dietary choices are best for you. Grains Baked goods made with fat, such as croissants, muffins, or some breads. Dry pasta or rice meal packs. Vegetables Creamed or fried vegetables. Vegetables in a cheese sauce. Regular canned vegetables (not low-sodium or reduced-sodium). Regular canned tomato sauce and paste (not low-sodium or reduced-sodium). Regular tomato and vegetable juice (not low-sodium or reduced-sodium). Pickles. Olives. Fruits Canned fruit in a light or heavy syrup. Fried fruit. Fruit in cream or butter sauce. Meat and other protein foods Fatty cuts of meat. Ribs. Fried meat. Bacon. Sausage. Bologna and other processed lunch meats. Salami. Fatback. Hotdogs. Bratwurst. Salted nuts and seeds. Canned beans with added salt. Canned or smoked fish. Whole eggs or egg yolks. Chicken or turkey with skin. Dairy Whole or 2% milk, cream, and half-and-half. Whole or full-fat cream cheese. Whole-fat or sweetened yogurt. Full-fat cheese. Nondairy creamers. Whipped toppings.  Processed cheese and cheese spreads. Fats and oils Butter. Stick margarine. Lard. Shortening. Ghee. Bacon fat. Tropical oils, such as coconut, palm kernel, or palm oil. Seasoning and other foods Salted popcorn and pretzels. Onion salt, garlic salt, seasoned salt, table salt, and sea salt. Worcestershire sauce. Tartar sauce. Barbecue sauce. Teriyaki sauce. Soy sauce, including reduced-sodium. Steak sauce. Canned and packaged gravies. Fish sauce. Oyster sauce. Cocktail sauce. Horseradish that you find on the shelf. Ketchup. Mustard. Meat flavorings and tenderizers. Bouillon cubes. Hot sauce and Tabasco sauce. Premade or packaged marinades. Premade or packaged taco seasonings. Relishes. Regular salad dressings. Where to find more information:  National Heart, Lung, and Blood Institute: www.nhlbi.nih.gov  American Heart Association: www.heart.org Summary  The DASH eating plan is a healthy eating plan that has been shown to reduce high blood pressure (hypertension). It may also reduce your risk for type 2 diabetes, heart disease, and stroke.  With the DASH eating plan, you should limit salt (sodium) intake to 2,300 mg a day. If you have hypertension, you may need to reduce your sodium intake to 1,500 mg a day.  When on the DASH eating plan, aim to eat more fresh fruits and vegetables, whole grains, lean proteins, low-fat dairy, and heart-healthy fats.  Work with your health care provider or diet and nutrition specialist (dietitian) to adjust your eating plan to your individual   calorie needs. This information is not intended to replace advice given to you by your health care provider. Make sure you discuss any questions you have with your health care provider. Document Released: 10/30/2011 Document Revised: 11/03/2016 Document Reviewed: 11/03/2016 Elsevier Interactive Patient Education  2018 [employer]  

## 2018-06-10 ENCOUNTER — Ambulatory Visit (AMBULATORY_SURGERY_CENTER): Payer: Medicare Other | Admitting: Gastroenterology

## 2018-06-10 ENCOUNTER — Encounter: Payer: Self-pay | Admitting: Gastroenterology

## 2018-06-10 ENCOUNTER — Other Ambulatory Visit: Payer: Self-pay

## 2018-06-10 VITALS — BP 131/68 | HR 66 | Temp 97.8°F | Resp 21 | Ht 62.0 in | Wt 169.0 lb

## 2018-06-10 DIAGNOSIS — R1013 Epigastric pain: Secondary | ICD-10-CM | POA: Diagnosis not present

## 2018-06-10 DIAGNOSIS — K219 Gastro-esophageal reflux disease without esophagitis: Secondary | ICD-10-CM | POA: Diagnosis not present

## 2018-06-10 DIAGNOSIS — D509 Iron deficiency anemia, unspecified: Secondary | ICD-10-CM | POA: Diagnosis not present

## 2018-06-10 DIAGNOSIS — K317 Polyp of stomach and duodenum: Secondary | ICD-10-CM | POA: Diagnosis not present

## 2018-06-10 MED ORDER — SODIUM CHLORIDE 0.9 % IV SOLN: 500.0000 mL | Freq: Once | INTRAVENOUS | Status: DC

## 2018-06-10 NOTE — Progress Notes (Signed)
Called to room to assist during endoscopic procedure.  Patient ID and intended procedure confirmed with present staff. Received instructions for my participation in the procedure from the performing physician.  

## 2018-06-10 NOTE — Op Note (Signed)
[facility] Patient Name: Isabella Andersen Procedure Date: 06/10/2018 10:06 AM MRN: [id number] Endoscopist: Ladene Artist , MD Age: 77 Referring MD:  Date of Birth: [birth year] Gender: Female Account #: [id number] Procedure:                Upper GI endoscopy Indications:              Iron deficiency anemia, Gastroesophageal reflux                            disease Medicines:                Monitored Anesthesia Care Procedure:                Pre-Anesthesia Assessment:                           - Prior to the procedure, a History and Physical                            was performed, and patient medications and                            allergies were reviewed. The patient's tolerance of                            previous anesthesia was also reviewed. The risks                            and benefits of the procedure and the sedation                            options and risks were discussed with the patient.                            All questions were answered, and informed consent                            was obtained. Prior Anticoagulants: The patient has                            taken no previous anticoagulant or antiplatelet                            agents. ASA Grade Assessment: II - A patient with                            mild systemic disease. After reviewing the risks                            and benefits, the patient was deemed in                            satisfactory condition to undergo the procedure.  After obtaining informed consent, the endoscope was                            passed under direct vision. Throughout the                            procedure, the patient's blood pressure, pulse, and                            oxygen saturations were monitored continuously. The                            Endoscope was introduced through the mouth, and                            advanced to the second part of duodenum. The  upper                            GI endoscopy was accomplished without difficulty.                            The patient tolerated the procedure well. Scope In: Scope Out: Findings:                 One benign-appearing, intrinsic mild stenosis was                            found at the gastroesophageal junction. This                            stenosis measured 1.5 cm (inner diameter). The                            stenosis was traversed.                           The exam of the esophagus was otherwise normal.                           A large hiatal hernia was present.                           A single 8 mm pedunculated polyp with no bleeding                            and no stigmata of recent bleeding was found in the                            gastric antrum. The polyp was removed with a cold                            snare. Resection and retrieval were complete.  Four 6 to 7 mm sessile polyps with no bleeding and                            no stigmata of recent bleeding were found in the                            gastric fundus and in the gastric body. The polyps                            were removed with a cold snare. Resection and                            retrieval were complete. Several smaller gastric                            body and fundus polyps noted and not removed.                           The exam of the stomach was otherwise normal.                           The duodenal bulb and second portion of the                            duodenum were normal. Complications:            No immediate complications. Estimated Blood Loss:     Estimated blood loss was minimal. Impression:               - Benign-appearing esophageal stenosis.                           - Large hiatal hernia.                           - A single gastric polyp. Resected and retrieved.                           - Four gastric polyps. Resected and retrieved.                            - Normal duodenal bulb and second portion of the                            duodenum. Recommendation:           - Patient has a contact number available for                            emergencies. The signs and symptoms of potential                            delayed complications were discussed with the  patient. Return to normal activities tomorrow.                            Written discharge instructions were provided to the                            patient.                           - Resume previous diet.                           - Antireflux measures long term.                           - Continue present medications.                           - Await pathology results. Ladene Artist, MD 06/10/2018 10:28:38 AM This report has been signed electronically.

## 2018-06-10 NOTE — Progress Notes (Signed)
To PACU, VSS. Report to [email or web address] 

## 2018-06-10 NOTE — Patient Instructions (Signed)
*   handout on GERD given for antireflux*   YOU HAD AN ENDOSCOPIC PROCEDURE TODAY AT [facility]:   Refer to the procedure report that was given to you for any specific questions about what was found during the examination.  If the procedure report does not answer your questions, please call your gastroenterologist to clarify.  If you requested that your care partner not be given the details of your procedure findings, then the procedure report has been included in a sealed envelope for you to review at your convenience later.  YOU SHOULD EXPECT: Some feelings of bloating in the abdomen. Passage of more gas than usual.  Walking can help get rid of the air that was put into your GI tract during the procedure and reduce the bloating. If you had a lower endoscopy (such as a colonoscopy or flexible sigmoidoscopy) you may notice spotting of blood in your stool or on the toilet paper. If you underwent a bowel prep for your procedure, you may not have a normal bowel movement for a few days.  Please Note:  You might notice some irritation and congestion in your nose or some drainage.  This is from the oxygen used during your procedure.  There is no need for concern and it should clear up in a day or so.  SYMPTOMS TO REPORT IMMEDIATELY:    Following upper endoscopy (EGD)  Vomiting of blood or coffee ground material  New chest pain or pain under the shoulder blades  Painful or persistently difficult swallowing  New shortness of breath  Fever of 100F or higher  Black, tarry-looking stools  For urgent or emergent issues, a gastroenterologist can be reached at any hour by calling [phone].   DIET:  We do recommend a small meal at first, but then you may proceed to your regular diet.  Drink plenty of fluids but you should avoid alcoholic beverages for 24 hours.  ACTIVITY:  You should plan to take it easy for the rest of today and you should NOT DRIVE or use heavy machinery until  tomorrow (because of the sedation medicines used during the test).    FOLLOW UP: Our staff will call the number listed on your records the next business day following your procedure to check on you and address any questions or concerns that you may have regarding the information given to you following your procedure. If we do not reach you, we will leave a message.  However, if you are feeling well and you are not experiencing any problems, there is no need to return our call.  We will assume that you have returned to your regular daily activities without incident.  If any biopsies were taken you will be contacted by phone or by letter within the next 1-3 weeks.  Please call us at [phone] if you have not heard about the biopsies in 3 weeks.    SIGNATURES/CONFIDENTIALITY: You and/or your care partner have signed paperwork which will be entered into your electronic medical record.  These signatures attest to the fact that that the information above on your After Visit Summary has been reviewed and is understood.  Full responsibility of the confidentiality of this discharge information lies with you and/or your care-partner.

## 2018-06-10 NOTE — Progress Notes (Signed)
History reviewed today 

## 2018-06-13 NOTE — Progress Notes (Signed)
Patient ID: Isabella Andersen, female   DOB: 1941/09/14, 77 y.o.   MRN: [id number]     Subjective:    Patient ID: Isabella Andersen, female    DOB: [birth year], 77 y.o.   MRN: [id number]  No chief complaint on file.   HPI Patient is in today for follow up. Her greatest concern is her ion pill is hurting he stomach with some nausea and discomfort. No bloody or tarry stool. No fevers or chills. She has an appointment with gastoenterology this week. She has otherwise been good, staying busy and trying to maintain a heat healthy diet. No recent febile illness o hospitalizations. Denies CP/palp/SOB/HA/congestion/fevers or GU c/o. Taking meds as prescribed  Past Medical History:  Diagnosis Date  . Anemia   . Anxiety   . Arthritis   . Cataract   . GERD (gastroesophageal reflux disease)   . H/O measles   . H/O mumps   . Heart murmur   . History of chicken pox   . Hyperlipidemia   . Hypertension   . Left shoulder pain 09/07/2017  . Osteoporosis   . Shingles   . Vitamin D deficiency 02/03/2016    Past Surgical History:  Procedure Laterality Date  . ABDOMINAL HYSTERECTOMY  1972  . APPENDECTOMY    . BLEPHAROPLASTY  2015  . CATARACT EXTRACTION     both eyes  . COLONOSCOPY    . EYE SURGERY Bilateral 2013   b/l cataracts, By Dr Dolores Lory at [place]  . FOOT SURGERY    . TONSILLECTOMY AND ADENOIDECTOMY      Family History  Problem Relation Age of Onset  . Colon cancer Mother        died of colon ca  . Cancer Mother        breast, colon cancer  . Colon polyps Sister   . Colon polyps Sister   . Heart disease Father        died of heart attack  . Heart disease Maternal Grandmother   . Heart disease Maternal Grandfather   . Cancer Maternal Aunt        breast  . Cancer Maternal Aunt        breast  . Heart disease Brother        MI  . Asthma Paternal Uncle   . Esophageal cancer Neg Hx   . Rectal cancer Neg Hx   . Stomach cancer Neg Hx     Social History   Socioeconomic History  .  Marital status: Married    Spouse name: Not on file  . Number of children: Not on file  . Years of education: Not on file  . Highest education level: Not on file  Occupational History  . Not on file  Social Needs  . Financial resource strain: Not on file  . Food insecurity:    Worry: Not on file    Inability: Not on file  . Transportation needs:    Medical: Not on file    Non-medical: Not on file  Tobacco Use  . Smoking status: Never Smoker  . Smokeless tobacco: Never Used  Substance and Sexual Activity  . Alcohol use: No    Alcohol/week: 0.0 oz  . Drug use: No  . 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 Physical activity:    Days per week: Not on file    Minutes per session: Not on file  .  Stress: Not on file  Relationships  . Social connections:    Talks on phone: Not on file    Gets together: Not on file    Attends religious service: Not on file    Active member of club or organization: Not on file    Attends meetings of clubs or organizations: Not on file    Relationship status: Not on file  . Intimate partner violence:    Fear of current or ex partner: Not on file    Emotionally abused: Not on file    Physically abused: Not on file    Forced sexual activity: Not on file  Other Topics Concern  . Not on file  Social History Narrative  . Not on file    Outpatient Medications Prior to Visit  Medication Sig Dispense Refill  . ALPRAZolam (XANAX) 0.25 MG tablet Take 1 tablet (0.25 mg total) by mouth 2 (two) times daily as needed for anxiety or sleep. 30 tablet 2  . atorvastatin (LIPITOR) 40 MG tablet TAKE 1 AND 1/2 TABLETS DAILY. 135 tablet 1  . hydrochlorothiazide (HYDRODIURIL) 25 MG tablet TAKE 1 TABLET EVERY DAY 90 tablet 0  . omeprazole (PRILOSEC) 40 MG capsule Take 1 capsule (40 mg total) by mouth daily. 90 capsule 1  . ranitidine (ZANTAC) 300 MG capsule Take 1 capsule (300 mg  total) by mouth every evening. 90 capsule 3  . Vitamin D, Ergocalciferol, (DRISDOL) 50000 units CAPS capsule Take 1 capsule (50,000 Units total) by mouth every 7 (seven) days. 4 capsule 4   No facility-administered medications prior to visit.     No Known Allergies  Review of Systems  Constitutional: Negative for fever and malaise/fatigue.  HENT: Negative for congestion.   Eyes: Negative for blurred vision.  Respiratory: Negative for shortness of breath.   Cardiovascular: Negative for chest pain, palpitations and leg swelling.  Gastrointestinal: Positive for abdominal pain, heartburn and nausea. Negative for blood in stool and melena.  Genitourinary: Negative for dysuria and frequency.  Musculoskeletal: Negative for falls.  Skin: Negative for rash.  Neurological: Negative for dizziness, loss of consciousness and headaches.  Endo/Heme/Allergies: Negative for environmental allergies.  Psychiatric/Behavioral: Negative for depression. The patient is not nervous/anxious.        Objective:    Physical Exam  Constitutional: She is oriented to person, place, and time. She appears well-developed and well-nourished. No distress.  HENT:  Head: Normocephalic and atraumatic.  Nose: Nose normal.  Eyes: Right eye exhibits no discharge. Left eye exhibits no discharge.  Neck: Normal range of motion. Neck supple.  Cardiovascular: Normal rate and regular rhythm.  No murmur heard. Pulmonary/Chest: Effort normal and breath sounds normal.  Abdominal: Soft. Bowel sounds are normal. There is no tenderness.  Musculoskeletal: She exhibits no edema.  Neurological: She is alert and oriented to person, place, and time.  Skin: Skin is warm and dry.  Psychiatric: She has a normal mood and affect.  Nursing note and vitals reviewed.   BP (!) 142/80 (BP Location: Left Arm, Patient Position: Sitting, Cuff Size: Normal)   Pulse 82   Temp 98.2 F (36.8 C) (Oral)   Resp 18   Wt 169 lb 3.2 oz (76.7 kg)    SpO2 98%   BMI 30.21 kg/m  Wt Readings from Last 3 Encounters:  06/10/18 169 lb (76.7 kg)  06/08/18 169 lb 3.2 oz (76.7 kg)  04/13/18 171 lb 4 oz (77.7 kg)     Lab Results  Component Value Date  WBC 5.8 06/08/2018   HGB 11.9 (L) 06/08/2018   HCT 36.8 06/08/2018   PLT 311.0 06/08/2018   GLUCOSE 95 06/08/2018   CHOL 197 03/08/2018   TRIG 84.0 03/08/2018   HDL 65.80 03/08/2018   LDLDIRECT 120.2 07/14/2012   LDLCALC 114 (H) 03/08/2018   ALT 19 06/08/2018   AST 18 06/08/2018   NA 141 06/08/2018   K 4.8 06/08/2018   CL 105 06/08/2018   CREATININE 0.72 06/08/2018   BUN 7 06/08/2018   CO2 29 06/08/2018   TSH 1.49 03/08/2018    Lab Results  Component Value Date   TSH 1.49 03/08/2018   Lab Results  Component Value Date   WBC 5.8 06/08/2018   HGB 11.9 (L) 06/08/2018   HCT 36.8 06/08/2018   MCV 76.1 (L) 06/08/2018   PLT 311.0 06/08/2018   Lab Results  Component Value Date   NA 141 06/08/2018   K 4.8 06/08/2018   CO2 29 06/08/2018   GLUCOSE 95 06/08/2018   BUN 7 06/08/2018   CREATININE 0.72 06/08/2018   BILITOT 0.3 06/08/2018   ALKPHOS 106 06/08/2018   AST 18 06/08/2018   ALT 19 06/08/2018   PROT 6.9 06/08/2018   ALBUMIN 4.6 06/08/2018   CALCIUM 9.5 06/08/2018   GFR 83.93 06/08/2018   Lab Results  Component Value Date   CHOL 197 03/08/2018   Lab Results  Component Value Date   HDL 65.80 03/08/2018   Lab Results  Component Value Date   LDLCALC 114 (H) 03/08/2018   Lab Results  Component Value Date   TRIG 84.0 03/08/2018   Lab Results  Component Value Date   CHOLHDL 3 03/08/2018   No results found for: HGBA1C     Assessment & Plan:   Problem List Items Addressed This Visit    GERD (gastroesophageal reflux disease)    Has an appointment with gastoenterology this week. RN blood check note reviewed. Agree with documention and plan.      Hyperlipidemia    Tolerating statin, encouraged heart healthy diet, avoid trans fats, minimize simple  carbs and saturated fats. Increase exercise as tolerated      Hypertension - Primary    Well controlled, no changes to meds. Encouraged heart healthy diet such as the DASH diet and exercise as tolerated.       Relevant Orders   Comprehensive metabolic panel (Completed)   Osteoporosis    Encouraged to get adequate exercise, calcium and vitamin d intake.      History of chicken pox   Vitamin D deficiency    Vitamin D 50000 IU weekly      Relevant Orders   VITAMIN D 25 Hydroxy (Vit-D Deficiency, Fractures) (Completed)   Anemia    Iron pills causing some GI upset. Will try a different suppplement and Increase leafy greens, consider increased lean red meat and using cast iron cookware. Continue to monitor, report any concerns      Relevant Orders   CBC with Differential/Platelet (Completed)   Ferritin (Completed)      I am having Isabella Andersen maintain her hydrochlorothiazide, ranitidine, ALPRAZolam, atorvastatin, omeprazole, and Vitamin D (Ergocalciferol).  No orders of the defined types were placed in this encounter.     [name], MD

## 2018-06-13 NOTE — Assessment & Plan Note (Signed)
Encouraged to get adequate exercise, calcium and vitamin d intake 

## 2018-06-13 NOTE — Assessment & Plan Note (Signed)
Well controlled, no changes to meds. Encouraged heart healthy diet such as the DASH diet and exercise as tolerated.  °

## 2018-06-13 NOTE — Assessment & Plan Note (Signed)
Vitamin D 50000 IU weekly

## 2018-06-13 NOTE — Assessment & Plan Note (Signed)
Has an appointment with gastoenterology this week. RN blood check note reviewed. Agree with documention and plan.

## 2018-06-13 NOTE — Assessment & Plan Note (Signed)
Tolerating statin, encouraged heart healthy diet, avoid trans fats, minimize simple carbs and saturated fats. Increase exercise as tolerated 

## 2018-06-13 NOTE — Assessment & Plan Note (Signed)
Iron pills causing some GI upset. Will try a different suppplement and Increase leafy greens, consider increased lean red meat and using cast iron cookware. Continue to monitor, report any concerns

## 2018-06-14 ENCOUNTER — Telehealth: Payer: Self-pay | Admitting: *Deleted

## 2018-06-14 NOTE — Telephone Encounter (Signed)
  Follow up Call-  Call back number 06/10/2018 03/27/2016  Post procedure Call Back phone  # [phone]  Permission to leave phone message Yes No  Some recent data might be hidden     Patient questions:  Do you have a fever, pain , or abdominal swelling? no Pain Score  0 *  Have you tolerated food without any problems? Yes.    Have you been able to return to your normal activities? Yes.    Do you have any questions about your discharge instructions: Diet   No. Medications  No. Follow up visit  No.  Do you have questions or concerns about your Care? No.  Actions: * If pain score is 4 or above: No action needed, pain <4.

## 2018-06-17 ENCOUNTER — Encounter: Payer: Self-pay | Admitting: Gastroenterology

## 2018-09-07 ENCOUNTER — Encounter: Payer: Self-pay | Admitting: Family Medicine

## 2018-09-07 ENCOUNTER — Ambulatory Visit (HOSPITAL_BASED_OUTPATIENT_CLINIC_OR_DEPARTMENT_OTHER)
Admission: RE | Admit: 2018-09-07 | Discharge: 2018-09-07 | Disposition: A | Discharge status: Home / Self Care | Payer: Medicare Other | Source: Ambulatory Visit | Attending: Family Medicine | Admitting: Family Medicine

## 2018-09-07 ENCOUNTER — Ambulatory Visit (INDEPENDENT_AMBULATORY_CARE_PROVIDER_SITE_OTHER): Payer: Medicare Other | Admitting: Family Medicine

## 2018-09-07 VITALS — BP 138/68 | HR 78 | Temp 97.8°F | Resp 18 | Wt 168.8 lb

## 2018-09-07 DIAGNOSIS — I10 Essential (primary) hypertension: Secondary | ICD-10-CM | POA: Diagnosis not present

## 2018-09-07 DIAGNOSIS — E559 Vitamin D deficiency, unspecified: Secondary | ICD-10-CM | POA: Diagnosis not present

## 2018-09-07 DIAGNOSIS — M8580 Other specified disorders of bone density and structure, unspecified site: Secondary | ICD-10-CM | POA: Insufficient documentation

## 2018-09-07 DIAGNOSIS — M25551 Pain in right hip: Secondary | ICD-10-CM

## 2018-09-07 DIAGNOSIS — M810 Age-related osteoporosis without current pathological fracture: Secondary | ICD-10-CM | POA: Diagnosis not present

## 2018-09-07 DIAGNOSIS — E785 Hyperlipidemia, unspecified: Secondary | ICD-10-CM

## 2018-09-07 DIAGNOSIS — F419 Anxiety disorder, unspecified: Secondary | ICD-10-CM | POA: Diagnosis not present

## 2018-09-07 DIAGNOSIS — M47816 Spondylosis without myelopathy or radiculopathy, lumbar region: Secondary | ICD-10-CM | POA: Diagnosis not present

## 2018-09-07 DIAGNOSIS — Z23 Encounter for immunization: Secondary | ICD-10-CM

## 2018-09-07 DIAGNOSIS — M7989 Other specified soft tissue disorders: Secondary | ICD-10-CM | POA: Diagnosis not present

## 2018-09-07 DIAGNOSIS — D509 Iron deficiency anemia, unspecified: Secondary | ICD-10-CM

## 2018-09-07 DIAGNOSIS — F32A Depression, unspecified: Secondary | ICD-10-CM

## 2018-09-07 DIAGNOSIS — M160 Bilateral primary osteoarthritis of hip: Secondary | ICD-10-CM | POA: Diagnosis not present

## 2018-09-07 DIAGNOSIS — K219 Gastro-esophageal reflux disease without esophagitis: Secondary | ICD-10-CM | POA: Diagnosis not present

## 2018-09-07 DIAGNOSIS — F329 Major depressive disorder, single episode, unspecified: Secondary | ICD-10-CM

## 2018-09-07 LAB — CBC
HCT: 35.8 % — ABNORMAL LOW (ref 36.0–46.0)
Hemoglobin: 11.7 g/dL — ABNORMAL LOW (ref 12.0–15.0)
MCHC: 32.6 g/dL (ref 30.0–36.0)
MCV: 80 fl (ref 78.0–100.0)
Platelets: 290 10*3/uL (ref 150.0–400.0)
RBC: 4.47 Mil/uL (ref 3.87–5.11)
RDW: 15.7 % — AB (ref 11.5–15.5)
WBC: 5.2 10*3/uL (ref 4.0–10.5)

## 2018-09-07 LAB — VITAMIN D 25 HYDROXY (VIT D DEFICIENCY, FRACTURES): VITD: 33.18 ng/mL (ref 30.00–100.00)

## 2018-09-07 LAB — LIPID PANEL
CHOL/HDL RATIO: 3
Cholesterol: 211 mg/dL — ABNORMAL HIGH (ref 0–200)
HDL: 64 mg/dL (ref >39.00)
LDL CALC: 111 mg/dL — AB (ref 0–99)
NONHDL: 146.55
TRIGLYCERIDES: 179 mg/dL — AB (ref 0.0–149.0)
VLDL: 35.8 mg/dL (ref 0.0–40.0)

## 2018-09-07 LAB — TSH: TSH: 1.67 u[IU]/mL (ref 0.35–4.50)

## 2018-09-07 LAB — COMPREHENSIVE METABOLIC PANEL
ALT: 17 U/L (ref 0–35)
AST: 14 U/L (ref 0–37)
Albumin: 4.5 g/dL (ref 3.5–5.2)
Alkaline Phosphatase: 116 U/L (ref 39–117)
BUN: 13 mg/dL (ref 6–23)
CHLORIDE: 104 meq/L (ref 96–112)
CO2: 29 meq/L (ref 19–32)
CREATININE: 0.69 mg/dL (ref 0.40–1.20)
Calcium: 9.3 mg/dL (ref 8.4–10.5)
GFR: 88.1 mL/min (ref >60.00)
GLUCOSE: 97 mg/dL (ref 70–99)
Potassium: 4.4 mEq/L (ref 3.5–5.1)
Sodium: 140 mEq/L (ref 135–145)
Total Bilirubin: 0.4 mg/dL (ref 0.2–1.2)
Total Protein: 6.7 g/dL (ref 6.0–8.3)

## 2018-09-07 MED ORDER — ATORVASTATIN CALCIUM 40 MG PO TABS: 60.0000 mg | ORAL_TABLET | Freq: Every day | ORAL | 1 refills | Status: DC

## 2018-09-07 NOTE — Assessment & Plan Note (Signed)
Encouraged heart healthy diet, increase exercise, avoid trans fats, consider a krill oil cap daily. Tolerating Atorvastatin 

## 2018-09-07 NOTE — Assessment & Plan Note (Signed)
Encouraged to get adequate exercise, calcium and vitamin d intake 

## 2018-09-07 NOTE — Patient Instructions (Addendum)
shingrix is the new shingles shot, 2 shots over 2-6 months. At pharmacy   Hypertension Hypertension is another name for high blood pressure. High blood pressure forces your heart to work harder to pump blood. This can cause problems over time. There are two numbers in a blood pressure reading. There is a top number (systolic) over a bottom number (diastolic). It is best to have a blood pressure below 120/80. Healthy choices can help lower your blood pressure. You may need medicine to help lower your blood pressure if:  Your blood pressure cannot be lowered with healthy choices.  Your blood pressure is higher than 130/80.  Follow these instructions at home: Eating and drinking  If directed, follow the DASH eating plan. This diet includes: ? Filling half of your plate at each meal with fruits and vegetables. ? Filling one quarter of your plate at each meal with whole grains. Whole grains include whole wheat pasta, brown rice, and whole grain bread. ? Eating or drinking low-fat dairy products, such as skim milk or low-fat yogurt. ? Filling one quarter of your plate at each meal with low-fat (lean) proteins. Low-fat proteins include fish, skinless chicken, eggs, beans, and tofu. ? Avoiding fatty meat, cured and processed meat, or chicken with skin. ? Avoiding premade or processed food.  Eat less than 1,500 mg of salt (sodium) a day.  Limit alcohol use to no more than 1 drink a day for nonpregnant women and 2 drinks a day for men. One drink equals 12 oz of beer, 5 oz of wine, or 1 oz of hard liquor. Lifestyle  Work with your doctor to stay at a healthy weight or to lose weight. Ask your doctor what the best weight is for you.  Get at least 30 minutes of exercise that causes your heart to beat faster (aerobic exercise) most days of the week. This may include walking, swimming, or biking.  Get at least 30 minutes of exercise that strengthens your muscles (resistance exercise) at least 3 days  a week. This may include lifting weights or pilates.  Do not use any products that contain nicotine or tobacco. This includes cigarettes and e-cigarettes. If you need help quitting, ask your doctor.  Check your blood pressure at home as told by your doctor.  Keep all follow-up visits as told by your doctor. This is important. Medicines  Take over-the-counter and prescription medicines only as told by your doctor. Follow directions carefully.  Do not skip doses of blood pressure medicine. The medicine does not work as well if you skip doses. Skipping doses also puts you at risk for problems.  Ask your doctor about side effects or reactions to medicines that you should watch for. Contact a doctor if:  You think you are having a reaction to the medicine you are taking.  You have headaches that keep coming back (recurring).  You feel dizzy.  You have swelling in your ankles.  You have trouble with your vision. Get help right away if:  You get a very bad headache.  You start to feel confused.  You feel weak or numb.  You feel faint.  You get very bad pain in your: ? Chest. ? Belly (abdomen).  You throw up (vomit) more than once.  You have trouble breathing. Summary  Hypertension is another name for high blood pressure.  Making healthy choices can help lower blood pressure. If your blood pressure cannot be controlled with healthy choices, you may need to take medicine.  This information is not intended to replace advice given to you by your health care provider. Make sure you discuss any questions you have with your health care provider. Document Released: 04/28/2008 Document Revised: 10/08/2016 Document Reviewed: 10/08/2016 Elsevier Interactive Patient Education  [employer].

## 2018-09-07 NOTE — Assessment & Plan Note (Signed)
Supplement and monitor 

## 2018-09-07 NOTE — Assessment & Plan Note (Addendum)
Worsening over several months but no fall or injury. Notes a lump in front of hip. Palpates like a lipoma will proceed with ultrasound and xray. Consider sports med referral once imaging available to help her manage her symptoms and mobility

## 2018-09-07 NOTE — Assessment & Plan Note (Signed)
Increase leafy greens, consider increased lean red meat and using cast iron cookware. Continue to monitor, report any concerns 

## 2018-09-07 NOTE — Progress Notes (Signed)
Subjective:    Patient ID: Isabella Andersen, female    DOB: Jan 20, 1941, 77 y.o.   MRN: [id number]  No chief complaint on file.   HPI Patient is in today for follow-up.  Overall she feels well but she is noting persistent bilateral lower extremity pain.  She describes her discomfort in both legs is aching but her right leg is significantly worse.  Does palpate a mass in the anterior portion of her right hip and has been present for several months.  It is not warm or fluctuant.  She denies any trauma falls or pain otherwise.  No recent febrile illness or hospitalizations.  Continues to struggle with left foot pain status post surgery many years ago but offers no acute concerns at today's visit. Denies CP/palp/SOB/HA/congestion/fevers/GI or GU c/o. Taking meds as prescribed  Past Medical History:  Diagnosis Date  . Anemia   . Anxiety   . Arthritis   . Cataract   . GERD (gastroesophageal reflux disease)   . H/O measles   . H/O mumps   . Heart murmur   . History of chicken pox   . Hyperlipidemia   . Hypertension   . Left shoulder pain 09/07/2017  . Osteoporosis   . Shingles   . Vitamin D deficiency 02/03/2016    Past Surgical History:  Procedure Laterality Date  . ABDOMINAL HYSTERECTOMY  1972  . APPENDECTOMY    . BLEPHAROPLASTY  2015  . CATARACT EXTRACTION     both eyes  . COLONOSCOPY    . EYE SURGERY Bilateral 2013   b/l cataracts, By Dr Dolores Lory at [place]  . FOOT SURGERY    . TONSILLECTOMY AND ADENOIDECTOMY      Family History  Problem Relation Age of Onset  . Colon cancer Mother        died of colon ca  . Cancer Mother        breast, colon cancer  . Colon polyps Sister   . Colon polyps Sister   . Heart disease Father        died of heart attack  . Heart disease Maternal Grandmother   . Heart disease Maternal Grandfather   . Cancer Maternal Aunt        breast  . Cancer Maternal Aunt        breast  . Heart disease Brother        MI  . Asthma Paternal Uncle   .  Esophageal cancer Neg Hx   . Rectal cancer Neg Hx   . Stomach cancer Neg Hx     Social History   Socioeconomic History  . Marital status: Married    Spouse name: Not on file  . Number of children: Not on file  . Years of education: Not on file  . Highest education level: Not on file  Occupational History  . Not on file  Social Needs  . Financial resource strain: Not on file  . Food insecurity:    Worry: Not on file    Inability: Not on file  . Transportation needs:    Medical: Not on file    Non-medical: Not on file  Tobacco Use  . Smoking status: Never Smoker  . Smokeless tobacco: Never Used  Substance and Sexual Activity  . Alcohol use: No    Alcohol/week: 0.0 standard drinks  . Drug use: No  . 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  Physical activity:    Days per week: Not on file    Minutes per session: Not on file  . Stress: Not on file  Relationships  . Social connections:    Talks on phone: Not on file    Gets together: Not on file    Attends religious service: Not on file    Active member of club or organization: Not on file    Attends meetings of clubs or organizations: Not on file    Relationship status: Not on file  . Intimate partner violence:    Fear of current or ex partner: Not on file    Emotionally abused: Not on file    Physically abused: Not on file    Forced sexual activity: Not on file  Other Topics Concern  . Not on file  Social History Narrative  . Not on file    Outpatient Medications Prior to Visit  Medication Sig Dispense Refill  . ALPRAZolam (XANAX) 0.25 MG tablet Take 1 tablet (0.25 mg total) by mouth 2 (two) times daily as needed for anxiety or sleep. 30 tablet 2  . hydrochlorothiazide (HYDRODIURIL) 25 MG tablet TAKE 1 TABLET EVERY DAY 90 tablet 0  . omeprazole (PRILOSEC) 40 MG capsule Take 1 capsule (40 mg total) by mouth daily. 90 capsule 1  .  ranitidine (ZANTAC) 300 MG capsule Take 1 capsule (300 mg total) by mouth every evening. 90 capsule 3  . Vitamin D, Ergocalciferol, (DRISDOL) 50000 units CAPS capsule Take 1 capsule (50,000 Units total) by mouth every 7 (seven) days. 4 capsule 4  . atorvastatin (LIPITOR) 40 MG tablet TAKE 1 AND 1/2 TABLETS DAILY. 135 tablet 1  . Fe Fum-FA-B Cmp-C-Zn-Mg-Mn-Cu (HEMOCYTE PLUS) 106-1 MG CAPS Take 1 tablet by mouth daily.  3  . 0.9 %  sodium chloride infusion      No facility-administered medications prior to visit.     No Known Allergies  Review of Systems  Constitutional: Negative for fever and malaise/fatigue.  HENT: Negative for congestion.   Eyes: Negative for blurred vision.  Respiratory: Negative for shortness of breath.   Cardiovascular: Negative for chest pain, palpitations and leg swelling.  Gastrointestinal: Negative for abdominal pain, blood in stool and nausea.  Genitourinary: Negative for dysuria and frequency.  Musculoskeletal: Positive for joint pain. Negative for falls.  Skin: Negative for rash.  Neurological: Negative for dizziness, loss of consciousness and headaches.  Endo/Heme/Allergies: Negative for environmental allergies.  Psychiatric/Behavioral: Negative for depression. The patient is not nervous/anxious.        Objective:    Physical Exam  Constitutional: She is oriented to person, place, and time. She appears well-developed and well-nourished. No distress.  HENT:  Head: Normocephalic and atraumatic.  Nose: Nose normal.  Eyes: Right eye exhibits no discharge. Left eye exhibits no discharge.  Neck: Normal range of motion. Neck supple.  Cardiovascular: Normal rate and regular rhythm.  No murmur heard. Pulmonary/Chest: Effort normal and breath sounds normal.  Abdominal: Soft. Bowel sounds are normal. There is no tenderness.  Musculoskeletal: She exhibits deformity. She exhibits no edema.  2 x 4-5 cm mass noted over anterior right hip. Soft and nontender.     Neurological: She is alert and oriented to person, place, and time.  Skin: Skin is warm and dry.  Psychiatric: She has a normal mood and affect.  Nursing note and vitals reviewed.   BP 138/68 (BP Location: Left Arm, Patient Position: Sitting, Cuff Size: Normal)   Pulse 78  Temp 97.8 F (36.6 C) (Oral)   Resp 18   Wt 168 lb 12.8 oz (76.6 kg)   SpO2 98%   BMI 30.87 kg/m  Wt Readings from Last 3 Encounters:  09/07/18 168 lb 12.8 oz (76.6 kg)  06/10/18 169 lb (76.7 kg)  06/08/18 169 lb 3.2 oz (76.7 kg)     Lab Results  Component Value Date   WBC 5.8 06/08/2018   HGB 11.9 (L) 06/08/2018   HCT 36.8 06/08/2018   PLT 311.0 06/08/2018   GLUCOSE 95 06/08/2018   CHOL 197 03/08/2018   TRIG 84.0 03/08/2018   HDL 65.80 03/08/2018   LDLDIRECT 120.2 07/14/2012   LDLCALC 114 (H) 03/08/2018   ALT 19 06/08/2018   AST 18 06/08/2018   NA 141 06/08/2018   K 4.8 06/08/2018   CL 105 06/08/2018   CREATININE 0.72 06/08/2018   BUN 7 06/08/2018   CO2 29 06/08/2018   TSH 1.49 03/08/2018    Lab Results  Component Value Date   TSH 1.49 03/08/2018   Lab Results  Component Value Date   WBC 5.8 06/08/2018   HGB 11.9 (L) 06/08/2018   HCT 36.8 06/08/2018   MCV 76.1 (L) 06/08/2018   PLT 311.0 06/08/2018   Lab Results  Component Value Date   NA 141 06/08/2018   K 4.8 06/08/2018   CO2 29 06/08/2018   GLUCOSE 95 06/08/2018   BUN 7 06/08/2018   CREATININE 0.72 06/08/2018   BILITOT 0.3 06/08/2018   ALKPHOS 106 06/08/2018   AST 18 06/08/2018   ALT 19 06/08/2018   PROT 6.9 06/08/2018   ALBUMIN 4.6 06/08/2018   CALCIUM 9.5 06/08/2018   GFR 83.93 06/08/2018   Lab Results  Component Value Date   CHOL 197 03/08/2018   Lab Results  Component Value Date   HDL 65.80 03/08/2018   Lab Results  Component Value Date   LDLCALC 114 (H) 03/08/2018   Lab Results  Component Value Date   TRIG 84.0 03/08/2018   Lab Results  Component Value Date   CHOLHDL 3 03/08/2018   No results  found for: HGBA1C     Assessment & Plan:   Problem List Items Addressed This Visit    GERD (gastroesophageal reflux disease)   Relevant Medications   atorvastatin (LIPITOR) 40 MG tablet   Hyperlipidemia    Encouraged heart healthy diet, increase exercise, avoid trans fats, consider a krill oil cap daily. Tolerating Atorvastatin      Relevant Medications   atorvastatin (LIPITOR) 40 MG tablet   Other Relevant Orders   Lipid panel   Hypertension    Well controlled, no changes to meds. Encouraged heart healthy diet such as the DASH diet and exercise as tolerated.       Relevant Medications   atorvastatin (LIPITOR) 40 MG tablet   Other Relevant Orders   CBC   Comprehensive metabolic panel   TSH   Osteoporosis    Encouraged to get adequate exercise, calcium and vitamin d intake      Relevant Medications   atorvastatin (LIPITOR) 40 MG tablet   Anxiety and depression   Relevant Medications   atorvastatin (LIPITOR) 40 MG tablet   Vitamin D deficiency    Supplement and monitor      Relevant Orders   VITAMIN D 25 Hydroxy (Vit-D Deficiency, Fractures)   Anemia    Increase leafy greens, consider increased lean red meat and using cast iron cookware. Continue to monitor, report any concerns  Right hip pain - Primary     Worsening over several months but no fall or injury. Notes a lump in front of hip. Palpates like a lipoma will proceed with ultrasound and xray. Consider sports med referral once imaging available to help her manage her symptoms and mobility      Relevant Orders   [place] RT LOWER EXTREM LTD SOFT TISSUE NON VASCULAR   DG HIP UNILAT WITH PELVIS 2-3 VIEWS RIGHT      I have discontinued Deandre W. Bayon's HEMOCYTE PLUS. I have also changed her atorvastatin. Additionally, I am having her maintain her hydrochlorothiazide, ranitidine, ALPRAZolam, omeprazole, and Vitamin D (Ergocalciferol). We will stop administering sodium chloride.  Meds ordered this encounter    Medications  . atorvastatin (LIPITOR) 40 MG tablet    Sig: Take 1.5 tablets (60 mg total) by mouth daily.    Dispense:  135 tablet    Refill:  1     [name], MD

## 2018-09-07 NOTE — Assessment & Plan Note (Signed)
Well controlled, no changes to meds. Encouraged heart healthy diet such as the DASH diet and exercise as tolerated.  °

## 2018-09-27 ENCOUNTER — Other Ambulatory Visit: Payer: Self-pay | Admitting: Family Medicine

## 2018-09-28 NOTE — Telephone Encounter (Signed)
Due to recent recall have her switch to Famotidine 40 mg po daily disp #30 with 5 rf or #90 with 1 rf at patient discretion.

## 2018-09-28 NOTE — Telephone Encounter (Signed)
Please advise on specific dose, dispense, refills

## 2018-09-29 MED ORDER — FAMOTIDINE 40 MG PO TABS: 40.0000 mg | ORAL_TABLET | Freq: Every day | ORAL | 5 refills | Status: DC

## 2018-11-29 ENCOUNTER — Encounter: Payer: Self-pay | Admitting: Family Medicine

## 2018-11-29 ENCOUNTER — Ambulatory Visit (INDEPENDENT_AMBULATORY_CARE_PROVIDER_SITE_OTHER): Payer: Medicare Other | Admitting: Family Medicine

## 2018-11-29 VITALS — BP 132/70 | HR 88 | Temp 98.0°F | Resp 18 | Wt 168.9 lb

## 2018-11-29 DIAGNOSIS — M25551 Pain in right hip: Secondary | ICD-10-CM | POA: Diagnosis not present

## 2018-11-29 DIAGNOSIS — I10 Essential (primary) hypertension: Secondary | ICD-10-CM | POA: Diagnosis not present

## 2018-11-29 DIAGNOSIS — E782 Mixed hyperlipidemia: Secondary | ICD-10-CM

## 2018-11-29 DIAGNOSIS — E559 Vitamin D deficiency, unspecified: Secondary | ICD-10-CM

## 2018-11-29 DIAGNOSIS — M810 Age-related osteoporosis without current pathological fracture: Secondary | ICD-10-CM | POA: Diagnosis not present

## 2018-11-29 LAB — COMPREHENSIVE METABOLIC PANEL
ALBUMIN: 4.4 g/dL (ref 3.5–5.2)
ALK PHOS: 106 U/L (ref 39–117)
ALT: 12 U/L (ref 0–35)
AST: 14 U/L (ref 0–37)
BILIRUBIN TOTAL: 0.4 mg/dL (ref 0.2–1.2)
BUN: 9 mg/dL (ref 6–23)
CALCIUM: 9.5 mg/dL (ref 8.4–10.5)
CO2: 27 meq/L (ref 19–32)
Chloride: 107 mEq/L (ref 96–112)
Creatinine, Ser: 0.67 mg/dL (ref 0.40–1.20)
GFR: 91.08 mL/min (ref >60.00)
Glucose, Bld: 100 mg/dL — ABNORMAL HIGH (ref 70–99)
Potassium: 4.7 mEq/L (ref 3.5–5.1)
Sodium: 141 mEq/L (ref 135–145)
Total Protein: 6.5 g/dL (ref 6.0–8.3)

## 2018-11-29 MED ORDER — OMEPRAZOLE 40 MG PO CPDR: 40.0000 mg | DELAYED_RELEASE_CAPSULE | Freq: Every day | ORAL | 1 refills | Status: DC

## 2018-11-29 NOTE — Assessment & Plan Note (Signed)
Persistent despite time. Is referred to orthopaedics. Is taking Ibuprofen 800 mg twice daily. She is encouraged to change to 400 mg every 4-6 hours and add Tylenol ES 500 mg twice daily

## 2018-11-29 NOTE — Patient Instructions (Signed)

## 2018-12-01 NOTE — Assessment & Plan Note (Signed)
Encouraged heart healthy diet, increase exercise, avoid trans fats, consider a krill oil cap daily 

## 2018-12-01 NOTE — Assessment & Plan Note (Signed)
Well controlled, no changes to meds. Encouraged heart healthy diet such as the DASH diet and exercise as tolerated.  °

## 2018-12-01 NOTE — Assessment & Plan Note (Signed)
Supplement and monitor 

## 2018-12-01 NOTE — Progress Notes (Signed)
Subjective:    Patient ID: Isabella Andersen, female    DOB: 18-Oct-1941, 78 y.o.   MRN: [id number]  No chief complaint on file.   HPI Patient is in today for evaluation of right hip pain. She denies any recent fall or trauma. No acute incontinence. She has had hip pain for years but it is worsening tot he point where OTC meds are not helping and her activities of daily living are being impacted. She is ready for a referral to have her options discussed. Denies CP/palp/SOB/HA/congestion/fevers/GI or GU c/o. Taking meds as prescribed.[email or web address]  Past Medical History:  Diagnosis Date  . Anemia   . Anxiety   . Arthritis   . Cataract   . GERD (gastroesophageal reflux disease)   . H/O measles   . H/O mumps   . Heart murmur   . History of chicken pox   . Hyperlipidemia   . Hypertension   . Left shoulder pain 09/07/2017  . Osteoporosis   . Shingles   . Vitamin D deficiency 02/03/2016    Past Surgical History:  Procedure Laterality Date  . ABDOMINAL HYSTERECTOMY  1972  . APPENDECTOMY    . BLEPHAROPLASTY  2015  . CATARACT EXTRACTION     both eyes  . COLONOSCOPY    . EYE SURGERY Bilateral 2013   b/l cataracts, By Dr Dolores Lory at [place]  . FOOT SURGERY    . TONSILLECTOMY AND ADENOIDECTOMY      Family History  Problem Relation Age of Onset  . Colon cancer Mother        died of colon ca  . Cancer Mother        breast, colon cancer  . Colon polyps Sister   . Colon polyps Sister   . Heart disease Father        died of heart attack  . Heart disease Maternal Grandmother   . Heart disease Maternal Grandfather   . Cancer Maternal Aunt        breast  . Cancer Maternal Aunt        breast  . Heart disease Brother        MI  . Asthma Paternal Uncle   . Esophageal cancer Neg Hx   . Rectal cancer Neg Hx   . Stomach cancer Neg Hx     Social History   Socioeconomic History  . Marital status: Married    Spouse name: Not on file  . Number of children: Not on file  . Years of education:  Not on file  . Highest education level: Not on file  Occupational History  . Not on file  Social Needs  . Financial resource strain: Not on file  . Food insecurity:    Worry: Not on file    Inability: Not on file  . Transportation needs:    Medical: Not on file    Non-medical: Not on file  Tobacco Use  . Smoking status: Never Smoker  . Smokeless tobacco: Never Used  Substance and Sexual Activity  . Alcohol use: No    Alcohol/week: 0.0 standard drinks  . Drug use: No  . 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 Physical activity:    Days per week: Not on file    Minutes per session: Not on file  . Stress: Not on file  Relationships  . Social connections:    Talks on phone: Not on  file    Gets together: Not on file    Attends religious service: Not on file    Active member of club or organization: Not on file    Attends meetings of clubs or organizations: Not on file    Relationship status: Not on file  . Intimate partner violence:    Fear of current or ex partner: Not on file    Emotionally abused: Not on file    Physically abused: Not on file    Forced sexual activity: Not on file  Other Topics Concern  . Not on file  Social History Narrative  . Not on file    Outpatient Medications Prior to Visit  Medication Sig Dispense Refill  . ALPRAZolam (XANAX) 0.25 MG tablet Take 1 tablet (0.25 mg total) by mouth 2 (two) times daily as needed for anxiety or sleep. 30 tablet 2  . atorvastatin (LIPITOR) 40 MG tablet Take 1.5 tablets (60 mg total) by mouth daily. 135 tablet 1  . hydrochlorothiazide (HYDRODIURIL) 25 MG tablet TAKE 1 TABLET EVERY DAY 90 tablet 0  . Vitamin D, Ergocalciferol, (DRISDOL) 50000 units CAPS capsule Take 1 capsule (50,000 Units total) by mouth every 7 (seven) days. 4 capsule 4  . famotidine (PEPCID) 40 MG tablet Take 1 tablet (40 mg total) by mouth daily. 30 tablet 5  .  omeprazole (PRILOSEC) 40 MG capsule Take 1 capsule (40 mg total) by mouth daily. 90 capsule 1   No facility-administered medications prior to visit.     No Known Allergies  Review of Systems  Constitutional: Negative for fever and malaise/fatigue.  HENT: Negative for congestion.   Eyes: Negative for blurred vision.  Respiratory: Negative for shortness of breath.   Cardiovascular: Negative for chest pain, palpitations and leg swelling.  Gastrointestinal: Negative for abdominal pain, blood in stool and nausea.  Genitourinary: Negative for dysuria and frequency.  Musculoskeletal: Positive for joint pain. Negative for falls.  Skin: Negative for rash.  Neurological: Negative for dizziness, loss of consciousness and headaches.  Endo/Heme/Allergies: Negative for environmental allergies.  Psychiatric/Behavioral: Negative for depression. The patient is not nervous/anxious.        Objective:    Physical Exam Vitals signs and nursing note reviewed.  Constitutional:      General: She is not in acute distress.    Appearance: She is well-developed.  HENT:     Head: Normocephalic and atraumatic.     Nose: Nose normal.  Eyes:     General:        Right eye: No discharge.        Left eye: No discharge.  Neck:     Musculoskeletal: Normal range of motion and neck supple.  Cardiovascular:     Rate and Rhythm: Normal rate and regular rhythm.     Heart sounds: No murmur.  Pulmonary:     Effort: Pulmonary effort is normal.     Breath sounds: Normal breath sounds.  Abdominal:     General: Bowel sounds are normal.     Palpations: Abdomen is soft.     Tenderness: There is no abdominal tenderness.  Skin:    General: Skin is warm and dry.  Neurological:     Mental Status: She is alert and oriented to person, place, and time.     BP 132/70 (BP Location: Left Arm, Patient Position: Sitting, Cuff Size: Normal)   Pulse 88   Temp 98 F (36.7 C) (Oral)   Resp 18   Wt 168 lb  14.4 oz (76.6  kg)   SpO2 98%   BMI 30.89 kg/m  Wt Readings from Last 3 Encounters:  11/29/18 168 lb 14.4 oz (76.6 kg)  09/07/18 168 lb 12.8 oz (76.6 kg)  06/10/18 169 lb (76.7 kg)     Lab Results  Component Value Date   WBC 5.2 09/07/2018   HGB 11.7 (L) 09/07/2018   HCT 35.8 (L) 09/07/2018   PLT 290.0 09/07/2018   GLUCOSE 100 (H) 11/29/2018   CHOL 211 (H) 09/07/2018   TRIG 179.0 (H) 09/07/2018   HDL 64.00 09/07/2018   LDLDIRECT 120.2 07/14/2012   LDLCALC 111 (H) 09/07/2018   ALT 12 11/29/2018   AST 14 11/29/2018   NA 141 11/29/2018   K 4.7 11/29/2018   CL 107 11/29/2018   CREATININE 0.67 11/29/2018   BUN 9 11/29/2018   CO2 27 11/29/2018   TSH 1.67 09/07/2018    Lab Results  Component Value Date   TSH 1.67 09/07/2018   Lab Results  Component Value Date   WBC 5.2 09/07/2018   HGB 11.7 (L) 09/07/2018   HCT 35.8 (L) 09/07/2018   MCV 80.0 09/07/2018   PLT 290.0 09/07/2018   Lab Results  Component Value Date   NA 141 11/29/2018   K 4.7 11/29/2018   CO2 27 11/29/2018   GLUCOSE 100 (H) 11/29/2018   BUN 9 11/29/2018   CREATININE 0.67 11/29/2018   BILITOT 0.4 11/29/2018   ALKPHOS 106 11/29/2018   AST 14 11/29/2018   ALT 12 11/29/2018   PROT 6.5 11/29/2018   ALBUMIN 4.4 11/29/2018   CALCIUM 9.5 11/29/2018   GFR 91.08 11/29/2018   Lab Results  Component Value Date   CHOL 211 (H) 09/07/2018   Lab Results  Component Value Date   HDL 64.00 09/07/2018   Lab Results  Component Value Date   LDLCALC 111 (H) 09/07/2018   Lab Results  Component Value Date   TRIG 179.0 (H) 09/07/2018   Lab Results  Component Value Date   CHOLHDL 3 09/07/2018   No results found for: HGBA1C     Assessment & Plan:   Problem List Items Addressed This Visit    Hyperlipidemia    Encouraged heart healthy diet, increase exercise, avoid trans fats, consider a krill oil cap daily      Hypertension - Primary    Well controlled, no changes to meds. Encouraged heart healthy diet such as  the DASH diet and exercise as tolerated.       Relevant Orders   Comprehensive metabolic panel (Completed)   Osteoporosis    Encouraged to get adequate exercise, calcium and vitamin d intake. Consider Prolia      Vitamin D deficiency    Supplement and monitor      Right hip pain    Persistent despite time. Is referred to orthopaedics. Is taking Ibuprofen 800 mg twice daily. She is encouraged to change to 400 mg every 4-6 hours and add Tylenol ES 500 mg twice daily      Relevant Orders   Ambulatory referral to Orthopedic Surgery      I have discontinued Deryl Ports. Bortner's famotidine. I am also having her maintain her hydrochlorothiazide, ALPRAZolam, Vitamin D (Ergocalciferol), atorvastatin, and omeprazole.  Meds ordered this encounter  Medications  . omeprazole (PRILOSEC) 40 MG capsule    Sig: Take 1 capsule (40 mg total) by mouth daily.    Dispense:  90 capsule    Refill:  1  [name], MD

## 2018-12-01 NOTE — Assessment & Plan Note (Signed)
Encouraged to get adequate exercise, calcium and vitamin d intake. Consider Prolia

## 2018-12-07 DIAGNOSIS — M25551 Pain in right hip: Secondary | ICD-10-CM | POA: Diagnosis not present

## 2018-12-10 DIAGNOSIS — M25551 Pain in right hip: Secondary | ICD-10-CM | POA: Diagnosis not present

## 2018-12-13 DIAGNOSIS — M25551 Pain in right hip: Secondary | ICD-10-CM | POA: Diagnosis not present

## 2019-03-06 ENCOUNTER — Other Ambulatory Visit: Payer: Self-pay | Admitting: Family Medicine

## 2019-03-06 DIAGNOSIS — F419 Anxiety disorder, unspecified: Principal | ICD-10-CM

## 2019-03-06 DIAGNOSIS — F329 Major depressive disorder, single episode, unspecified: Secondary | ICD-10-CM

## 2019-03-06 DIAGNOSIS — I10 Essential (primary) hypertension: Secondary | ICD-10-CM

## 2019-03-06 DIAGNOSIS — E785 Hyperlipidemia, unspecified: Secondary | ICD-10-CM

## 2019-03-06 DIAGNOSIS — M810 Age-related osteoporosis without current pathological fracture: Secondary | ICD-10-CM

## 2019-03-06 DIAGNOSIS — F32A Depression, unspecified: Secondary | ICD-10-CM

## 2019-03-06 DIAGNOSIS — K219 Gastro-esophageal reflux disease without esophagitis: Secondary | ICD-10-CM

## 2019-03-08 ENCOUNTER — Ambulatory Visit: Payer: Medicare Other | Admitting: Family Medicine

## 2019-05-12 ENCOUNTER — Ambulatory Visit: Payer: Medicare Other | Admitting: Family Medicine

## 2019-05-17 ENCOUNTER — Ambulatory Visit (INDEPENDENT_AMBULATORY_CARE_PROVIDER_SITE_OTHER): Payer: Medicare Other | Admitting: Family Medicine

## 2019-05-17 ENCOUNTER — Other Ambulatory Visit: Payer: Self-pay

## 2019-05-17 DIAGNOSIS — E785 Hyperlipidemia, unspecified: Secondary | ICD-10-CM

## 2019-05-17 DIAGNOSIS — K219 Gastro-esophageal reflux disease without esophagitis: Secondary | ICD-10-CM | POA: Diagnosis not present

## 2019-05-17 DIAGNOSIS — F419 Anxiety disorder, unspecified: Secondary | ICD-10-CM | POA: Diagnosis not present

## 2019-05-17 DIAGNOSIS — I10 Essential (primary) hypertension: Secondary | ICD-10-CM | POA: Diagnosis not present

## 2019-05-17 DIAGNOSIS — F329 Major depressive disorder, single episode, unspecified: Secondary | ICD-10-CM | POA: Diagnosis not present

## 2019-05-17 DIAGNOSIS — Z1239 Encounter for other screening for malignant neoplasm of breast: Secondary | ICD-10-CM | POA: Diagnosis not present

## 2019-05-17 DIAGNOSIS — M810 Age-related osteoporosis without current pathological fracture: Secondary | ICD-10-CM

## 2019-05-17 DIAGNOSIS — E559 Vitamin D deficiency, unspecified: Secondary | ICD-10-CM | POA: Diagnosis not present

## 2019-05-17 MED ORDER — HYDROCHLOROTHIAZIDE 25 MG PO TABS: 25.0000 mg | ORAL_TABLET | Freq: Every day | ORAL | 1 refills | Status: DC

## 2019-05-17 MED ORDER — OMEPRAZOLE 40 MG PO CPDR: 40.0000 mg | DELAYED_RELEASE_CAPSULE | Freq: Every day | ORAL | 1 refills | Status: AC

## 2019-05-17 NOTE — Assessment & Plan Note (Signed)
Supplement and monitor 

## 2019-05-17 NOTE — Assessment & Plan Note (Signed)
Encouraged to take vital signs weekly  no changes to meds. Encouraged heart healthy diet such as the DASH diet and exercise as tolerated.

## 2019-05-17 NOTE — Assessment & Plan Note (Signed)
Needs her Omeprazole and Famotidine daily to manage her symptoms. Refill given for Omeprazole

## 2019-05-17 NOTE — Assessment & Plan Note (Signed)
Managing the stress of the pandemic well and social distancing well

## 2019-05-17 NOTE — Assessment & Plan Note (Signed)
Tolerating statin, encouraged heart healthy diet, avoid trans fats, minimize simple carbs and saturated fats. Increase exercise as tolerated 

## 2019-05-17 NOTE — Progress Notes (Signed)
Virtual Visit via Video Note  I connected with Isabella Andersen on 05/17/19 at  3:20 PM EDT by a video enabled telemedicine application and verified that I am speaking with the correct person using two identifiers.  Location: Patient: home Provider: home   I discussed the limitations of evaluation and management by telemedicine and the availability of in person appointments. The patient expressed understanding and agreed to proceed. [name] Eulas Post CMA was able to get patient set up on video platform    Subjective:    Patient ID: Isabella Andersen, female    DOB: 24-Jun-1941, 78 y.o.   MRN: [id number]  No chief complaint on file.   HPI Patient is in today for follow up on chronic medical concerns including reflux, hyperlipidemia, hypertension and more. She is doing well on her current meds if she misses her acid suppressing meds she has symptoms. No recent febrile illness or hospitalizations. She is social distancing and quarantining well. Denies CP/palp/SOB/HA/congestion/fevers or GU c/o. Taking meds as prescribed  Past Medical History:  Diagnosis Date  . Anemia   . Anxiety   . Arthritis   . Cataract   . GERD (gastroesophageal reflux disease)   . H/O measles   . H/O mumps   . Heart murmur   . History of chicken pox   . Hyperlipidemia   . Hypertension   . Left shoulder pain 09/07/2017  . Osteoporosis   . Shingles   . Vitamin D deficiency 02/03/2016    Past Surgical History:  Procedure Laterality Date  . ABDOMINAL HYSTERECTOMY  1972  . APPENDECTOMY    . BLEPHAROPLASTY  2015  . CATARACT EXTRACTION     both eyes  . COLONOSCOPY    . EYE SURGERY Bilateral 2013   b/l cataracts, By Dr Dolores Lory at [place]  . FOOT SURGERY    . TONSILLECTOMY AND ADENOIDECTOMY      Family History  Problem Relation Age of Onset  . Colon cancer Mother        died of colon ca  . Cancer Mother        breast, colon cancer  . Colon polyps Sister   . Colon polyps Sister   . Heart disease Father     died of heart attack  . Heart disease Maternal Grandmother   . Heart disease Maternal Grandfather   . Cancer Maternal Aunt        breast  . Cancer Maternal Aunt        breast  . Heart disease Brother        MI  . Asthma Paternal Uncle   . Esophageal cancer Neg Hx   . Rectal cancer Neg Hx   . Stomach cancer Neg Hx     Social History   Socioeconomic History  . Marital status: Married    Spouse name: Not on file  . Number of children: Not on file  . Years of education: Not on file  . Highest education level: Not on file  Occupational History  . Not on file  Social Needs  . Financial resource strain: Not on file  . Food insecurity    Worry: Not on file    Inability: Not on file  . Transportation needs    Medical: Not on file    Non-medical: Not on file  Tobacco Use  . Smoking status: Never Smoker  . Smokeless tobacco: Never Used  Substance and Sexual Activity  . Alcohol use: No    Alcohol/week: 0.0  standard drinks  . Drug use: No  . 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 Physical activity    Days per week: Not on file    Minutes per session: Not on file  . Stress: Not on file  Relationships  . Social Herbalist on phone: Not on file    Gets together: Not on file    Attends religious service: Not on file    Active member of club or organization: Not on file    Attends meetings of clubs or organizations: Not on file    Relationship status: Not on file  . Intimate partner violence    Fear of current or ex partner: Not on file    Emotionally abused: Not on file    Physically abused: Not on file    Forced sexual activity: Not on file  Other Topics Concern  . Not on file  Social History Narrative  . Not on file    Outpatient Medications Prior to Visit  Medication Sig Dispense Refill  . ALPRAZolam (XANAX) 0.25 MG tablet Take 1 tablet (0.25 mg total) by mouth 2 (two)  times daily as needed for anxiety or sleep. 30 tablet 2  . atorvastatin (LIPITOR) 40 MG tablet TAKE 1 AND 1/2 TABLETS BY MOUTH DAILY 135 tablet 1  . Vitamin D, Ergocalciferol, (DRISDOL) 50000 units CAPS capsule Take 1 capsule (50,000 Units total) by mouth every 7 (seven) days. 4 capsule 4  . hydrochlorothiazide (HYDRODIURIL) 25 MG tablet TAKE 1 TABLET EVERY DAY 90 tablet 0  . omeprazole (PRILOSEC) 40 MG capsule Take 1 capsule (40 mg total) by mouth daily. 90 capsule 1   No facility-administered medications prior to visit.     No Known Allergies  Review of Systems  Constitutional: Negative for fever and malaise/fatigue.  HENT: Negative for congestion.   Eyes: Negative for blurred vision.  Respiratory: Negative for shortness of breath.   Cardiovascular: Negative for chest pain, palpitations and leg swelling.  Gastrointestinal: Negative for abdominal pain, blood in stool and nausea.  Genitourinary: Negative for dysuria and frequency.  Musculoskeletal: Negative for falls.  Skin: Negative for rash.  Neurological: Negative for dizziness, loss of consciousness and headaches.  Endo/Heme/Allergies: Negative for environmental allergies.  Psychiatric/Behavioral: Negative for depression. The patient is not nervous/anxious.        Objective:    Physical Exam Constitutional:      Appearance: Normal appearance. She is not ill-appearing.  HENT:     Head: Normocephalic and atraumatic.     Nose: Nose normal.  Eyes:     General:        Right eye: No discharge.        Left eye: No discharge.  Pulmonary:     Effort: Pulmonary effort is normal.  Neurological:     Mental Status: She is alert and oriented to person, place, and time.  Psychiatric:        Mood and Affect: Mood normal.        Behavior: Behavior normal.     There were no vitals taken for this visit. Wt Readings from Last 3 Encounters:  11/29/18 168 lb 14.4 oz (76.6 kg)  09/07/18 168 lb 12.8 oz (76.6 kg)  06/10/18 169 lb  (76.7 kg)    Diabetic Foot Exam - Simple   No data filed     Lab Results  Component Value Date  WBC 5.2 09/07/2018   HGB 11.7 (L) 09/07/2018   HCT 35.8 (L) 09/07/2018   PLT 290.0 09/07/2018   GLUCOSE 100 (H) 11/29/2018   CHOL 211 (H) 09/07/2018   TRIG 179.0 (H) 09/07/2018   HDL 64.00 09/07/2018   LDLDIRECT 120.2 07/14/2012   LDLCALC 111 (H) 09/07/2018   ALT 12 11/29/2018   AST 14 11/29/2018   NA 141 11/29/2018   K 4.7 11/29/2018   CL 107 11/29/2018   CREATININE 0.67 11/29/2018   BUN 9 11/29/2018   CO2 27 11/29/2018   TSH 1.67 09/07/2018    Lab Results  Component Value Date   TSH 1.67 09/07/2018   Lab Results  Component Value Date   WBC 5.2 09/07/2018   HGB 11.7 (L) 09/07/2018   HCT 35.8 (L) 09/07/2018   MCV 80.0 09/07/2018   PLT 290.0 09/07/2018   Lab Results  Component Value Date   NA 141 11/29/2018   K 4.7 11/29/2018   CO2 27 11/29/2018   GLUCOSE 100 (H) 11/29/2018   BUN 9 11/29/2018   CREATININE 0.67 11/29/2018   BILITOT 0.4 11/29/2018   ALKPHOS 106 11/29/2018   AST 14 11/29/2018   ALT 12 11/29/2018   PROT 6.5 11/29/2018   ALBUMIN 4.4 11/29/2018   CALCIUM 9.5 11/29/2018   GFR 91.08 11/29/2018   Lab Results  Component Value Date   CHOL 211 (H) 09/07/2018   Lab Results  Component Value Date   HDL 64.00 09/07/2018   Lab Results  Component Value Date   LDLCALC 111 (H) 09/07/2018   Lab Results  Component Value Date   TRIG 179.0 (H) 09/07/2018   Lab Results  Component Value Date   CHOLHDL 3 09/07/2018   No results found for: HGBA1C     Assessment & Plan:   Problem List Items Addressed This Visit    GERD (gastroesophageal reflux disease)    Needs her Omeprazole and Famotidine daily to manage her symptoms. Refill given for Omeprazole       Relevant Medications   omeprazole (PRILOSEC) 40 MG capsule   hydrochlorothiazide (HYDRODIURIL) 25 MG tablet   Hyperlipidemia    Tolerating statin, encouraged heart healthy diet, avoid trans  fats, minimize simple carbs and saturated fats. Increase exercise as tolerated      Relevant Medications   hydrochlorothiazide (HYDRODIURIL) 25 MG tablet   Hypertension    Encouraged to take vital signs weekly  no changes to meds. Encouraged heart healthy diet such as the DASH diet and exercise as tolerated.       Relevant Medications   hydrochlorothiazide (HYDRODIURIL) 25 MG tablet   Osteoporosis   Relevant Medications   hydrochlorothiazide (HYDRODIURIL) 25 MG tablet   Anxiety and depression    Managing the stress of the pandemic well and social distancing well      Relevant Medications   hydrochlorothiazide (HYDRODIURIL) 25 MG tablet   Vitamin D deficiency    Supplement and monitor       Other Visit Diagnoses    Breast cancer screening    -  Primary   Relevant Orders   MM 3D SCREEN BREAST BILATERAL      I have changed Marquis Buggy. Lavergne's hydrochlorothiazide. I am also having her maintain her ALPRAZolam, Vitamin D (Ergocalciferol), atorvastatin, and omeprazole.  Meds ordered this encounter  Medications  . omeprazole (PRILOSEC) 40 MG capsule    Sig: Take 1 capsule (40 mg total) by mouth daily.    Dispense:  90 capsule  Refill:  1  . hydrochlorothiazide (HYDRODIURIL) 25 MG tablet    Sig: Take 1 tablet (25 mg total) by mouth daily.    Dispense:  90 tablet    Refill:  1   I discussed the assessment and treatment plan with the patient. The patient was provided an opportunity to ask questions and all were answered. The patient agreed with the plan and demonstrated an understanding of the instructions.   The patient was advised to call back or seek an in-person evaluation if the symptoms worsen or if the condition fails to improve as anticipated.  I provided 25 minutes of non-face-to-face time during this encounter.   [name], MD

## 2019-05-31 ENCOUNTER — Ambulatory Visit (HOSPITAL_BASED_OUTPATIENT_CLINIC_OR_DEPARTMENT_OTHER)
Admission: RE | Admit: 2019-05-31 | Discharge: 2019-05-31 | Disposition: A | Discharge status: Home / Self Care | Payer: Medicare Other | Source: Ambulatory Visit | Attending: Family Medicine | Admitting: Family Medicine

## 2019-05-31 ENCOUNTER — Other Ambulatory Visit: Payer: Self-pay

## 2019-05-31 DIAGNOSIS — Z1231 Encounter for screening mammogram for malignant neoplasm of breast: Secondary | ICD-10-CM | POA: Insufficient documentation

## 2019-05-31 DIAGNOSIS — Z1239 Encounter for other screening for malignant neoplasm of breast: Secondary | ICD-10-CM | POA: Diagnosis present

## 2019-08-09 ENCOUNTER — Ambulatory Visit (INDEPENDENT_AMBULATORY_CARE_PROVIDER_SITE_OTHER): Payer: Medicare Other | Admitting: *Deleted

## 2019-08-09 ENCOUNTER — Other Ambulatory Visit: Payer: Self-pay

## 2019-08-09 ENCOUNTER — Other Ambulatory Visit (INDEPENDENT_AMBULATORY_CARE_PROVIDER_SITE_OTHER): Payer: Medicare Other

## 2019-08-09 DIAGNOSIS — E785 Hyperlipidemia, unspecified: Secondary | ICD-10-CM

## 2019-08-09 DIAGNOSIS — E559 Vitamin D deficiency, unspecified: Secondary | ICD-10-CM

## 2019-08-09 DIAGNOSIS — I10 Essential (primary) hypertension: Secondary | ICD-10-CM

## 2019-08-09 DIAGNOSIS — K625 Hemorrhage of anus and rectum: Secondary | ICD-10-CM

## 2019-08-09 DIAGNOSIS — Z23 Encounter for immunization: Secondary | ICD-10-CM

## 2019-08-09 LAB — CBC
HCT: 27.9 % — ABNORMAL LOW (ref 36.0–46.0)
Hemoglobin: 8.5 g/dL — ABNORMAL LOW (ref 12.0–15.0)
MCHC: 30.3 g/dL (ref 30.0–36.0)
MCV: 65.9 fl — ABNORMAL LOW (ref 78.0–100.0)
Platelets: 338 10*3/uL (ref 150.0–400.0)
RBC: 4.23 Mil/uL (ref 3.87–5.11)
RDW: 19.5 % — ABNORMAL HIGH (ref 11.5–15.5)
WBC: 4.8 10*3/uL (ref 4.0–10.5)

## 2019-08-09 LAB — COMPREHENSIVE METABOLIC PANEL
ALT: 13 U/L (ref 0–35)
AST: 17 U/L (ref 0–37)
Albumin: 4.4 g/dL (ref 3.5–5.2)
Alkaline Phosphatase: 99 U/L (ref 39–117)
BUN: 9 mg/dL (ref 6–23)
CO2: 28 mEq/L (ref 19–32)
Calcium: 10 mg/dL (ref 8.4–10.5)
Chloride: 105 mEq/L (ref 96–112)
Creatinine, Ser: 0.67 mg/dL (ref 0.40–1.20)
GFR: 85.54 mL/min (ref >60.00)
Glucose, Bld: 96 mg/dL (ref 70–99)
Potassium: 4.6 mEq/L (ref 3.5–5.1)
Sodium: 141 mEq/L (ref 135–145)
Total Bilirubin: 0.4 mg/dL (ref 0.2–1.2)
Total Protein: 6.5 g/dL (ref 6.0–8.3)

## 2019-08-09 LAB — LIPID PANEL
Cholesterol: 214 mg/dL — ABNORMAL HIGH (ref 0–200)
HDL: 66.7 mg/dL (ref >39.00)
LDL Cholesterol: 124 mg/dL — ABNORMAL HIGH (ref 0–99)
NonHDL: 147.51
Total CHOL/HDL Ratio: 3
Triglycerides: 120 mg/dL (ref 0.0–149.0)
VLDL: 24 mg/dL (ref 0.0–40.0)

## 2019-08-09 LAB — TSH: TSH: 2.59 u[IU]/mL (ref 0.35–4.50)

## 2019-08-09 LAB — VITAMIN D 25 HYDROXY (VIT D DEFICIENCY, FRACTURES): VITD: 24.26 ng/mL — ABNORMAL LOW (ref 30.00–100.00)

## 2019-08-09 NOTE — Progress Notes (Signed)
Patient here for flu vaccine.  Vaccine given and patient tolerated well.

## 2019-08-10 DIAGNOSIS — D649 Anemia, unspecified: Secondary | ICD-10-CM

## 2019-08-10 DIAGNOSIS — K625 Hemorrhage of anus and rectum: Secondary | ICD-10-CM

## 2019-08-10 MED ORDER — FERROUS FUMARATE 324 (106 FE) MG PO TABS: 1.0000 | ORAL_TABLET | Freq: Every day | ORAL | 3 refills | Status: DC

## 2019-08-10 NOTE — Addendum Note (Signed)
Addended by: Magdalene Molly A on: 08/10/2019 07:18 AM   Modules accepted: Orders

## 2019-08-17 ENCOUNTER — Other Ambulatory Visit: Payer: Self-pay

## 2019-08-17 ENCOUNTER — Other Ambulatory Visit: Payer: Self-pay | Admitting: Family Medicine

## 2019-08-17 ENCOUNTER — Other Ambulatory Visit (INDEPENDENT_AMBULATORY_CARE_PROVIDER_SITE_OTHER): Payer: Medicare Other

## 2019-08-17 DIAGNOSIS — K625 Hemorrhage of anus and rectum: Secondary | ICD-10-CM | POA: Diagnosis not present

## 2019-08-17 DIAGNOSIS — D649 Anemia, unspecified: Secondary | ICD-10-CM | POA: Diagnosis not present

## 2019-08-17 DIAGNOSIS — D509 Iron deficiency anemia, unspecified: Secondary | ICD-10-CM

## 2019-08-17 LAB — CBC
HCT: 28 % — ABNORMAL LOW (ref 36.0–46.0)
Hemoglobin: 8.4 g/dL — ABNORMAL LOW (ref 12.0–15.0)
MCHC: 30.1 g/dL (ref 30.0–36.0)
MCV: 67 fl — ABNORMAL LOW (ref 78.0–100.0)
Platelets: 302 10*3/uL (ref 150.0–400.0)
RBC: 4.17 Mil/uL (ref 3.87–5.11)
RDW: 19.9 % — ABNORMAL HIGH (ref 11.5–15.5)
WBC: 5 10*3/uL (ref 4.0–10.5)

## 2019-08-17 LAB — RETICULOCYTES
ABS Retic: 71740 cells/uL (ref 20000–8000)
Retic Ct Pct: 1.7 %

## 2019-08-17 LAB — FERRITIN: Ferritin: 6.2 ng/mL — ABNORMAL LOW (ref 10.0–291.0)

## 2019-08-19 ENCOUNTER — Telehealth: Payer: Self-pay

## 2019-08-19 ENCOUNTER — Telehealth: Payer: Self-pay | Admitting: Hematology

## 2019-08-19 NOTE — Telephone Encounter (Signed)
lmom to inform patient of new patient appt 10/8 at 2 pm

## 2019-08-19 NOTE — Telephone Encounter (Signed)
Copied from [place] [id number]. Topic: General - Other >> Aug 19, 2019 11:45 AM Celene Kras A wrote: Reason for CRM: Pt called and is requesting to have PCP call her regarding the referral that was place. Pt is concerned. Please advise.

## 2019-08-23 NOTE — Telephone Encounter (Signed)
Called left patient voicemail to return my call. Needed more information

## 2019-08-29 NOTE — Progress Notes (Signed)
Isabella Andersen NOTE  Patient Care Team: Mosie Lukes, MD as PCP - General (Family Medicine) Druscilla Brownie, MD as Consulting Physician (Dermatology)  HEME/ONC OVERVIEW: 1. Iron deficiency anemia -Etiology unclear; Hgb mid-8's in 07/2019 (11 in 2019) -No evidence of bleeding or malignancy on EGD in 2019 colonoscopy in 2017  ASSESSMENT & PLAN:   Iron deficiency anemia -I reviewed the patient's records in detail, including PCP and GI clinic notes, lab studies, procedure results and pathology reports -In summary, patient had normal CBC until late 2018, when her Hgb dropped to mid-11's.  In early 2019,  Hgb was in the 9's with MCV in the 70's.  It improved to mid-11's by the end of 2019, but on repeat CBC in 07/2019, her Hgb had again dropped into the mid-9's.  Ferritin was low at 6.2.  She had EGD in 05/2018 and a colonoscopy in 2017 that did not show any evidence of malignancy or bleeding.  Patient was referred to hematology for further evaluation of iron deficiency anemia. -I discussed the lab findings in detail with the patient -In light of the recurrent severe iron deficiency anemia, this is very concerning for ongoing occult bleeding, such as GI or gynecologic -Hgb 9.5 w/ MCV 72 today, slightly improving but remaining very low; iron profile pending -I personally reviewed the patient's peripheral blood smear today.  The red blood cells were microcytic and hypochromic, with scattered ovalocytes, c/w with iron deficiency anemia.  There was no schistocytosis.  The white blood cells were of normal morphology. There were no peripheral circulating blasts. The platelets were of normal size and I verified that there were no platelet clumping. -We discussed some of the risks, benefits, and alternatives of intravenous iron infusions.  -The patient is symptomatic from anemia and the iron level is critically low; as such, oral supplement is not sufficient to replete iron storage  quickly and she will need IV iron to higher levels of iron faster for adequate hematopoiesis.  -Some of the side-effects to be expected including risks of infusion reactions, phlebitis, headaches, nausea and fatigue.   -The patient is willing to proceed.  We will tentatively schedule the 1st dose on 09/08/2019, plan for 2 doses.  -Goal is to keep ferritin level greater than 50. -I have referred the patient to GI for further evaluation to rule out any bleeding, including small bowel source as needed   Thrombocytosis -Likely reactive in the setting of iron deficiency anemia -Plts 402k today, new -See the management of IDA above -We will monitor it for now   Orders Placed This Encounter  Procedures  . CBC with Differential (Cancer Center Only)    Standing Status:   Future    Standing Expiration Date:   10/05/2020  . CMP ([facility] only)    Standing Status:   Future    Standing Expiration Date:   10/05/2020  . Save Smear ([facility])    Standing Status:   Future    Standing Expiration Date:   08/31/2020  . Ferritin    Standing Status:   Future    Standing Expiration Date:   10/05/2020  . Iron and TIBC    Standing Status:   Future    Standing Expiration Date:   10/05/2020  . Ambulatory referral to Gastroenterology    Referral Priority:   Urgent    Referral Type:   Consultation    Referral Reason:   Specialty Services Required    Referred to Provider:  Cirigliano, Vito V, DO    Number of Visits Requested:   1   All questions were answered. The patient knows to call the clinic with any problems, questions or concerns.  Return in 2 months for labs and clinic follow-up.   Tish Men, MD 09/01/2019 3:28 PM   CHIEF COMPLAINTS/PURPOSE OF CONSULTATION:  "I am just so pale"  HISTORY OF PRESENTING ILLNESS:  Isabella Andersen 78 y.o. female is here because of severe iron deficiency anemia.   Patient had normal CBC until late 2018, when her Hgb dropped to mid-11's, and in early 2019, as low  as in the 9's with MCV in the 70's. It improved to mid-11's by the end of 2019, but on repeat CBC in 07/2019, her Hgb had again dropped into the mid-9's.  Ferritin was low at 6.2.  She had EGD in 05/2018 and a colonoscopy in 2017 that did not show any evidence of malignancy or bleeding.  Patient was referred to hematology for further evaluation of iron deficiency anemia.  Patient reports that over the past 6 months, she has had a constant craving for ice, as well as intermittent metallic taste in her mouth.  She feels full from eating so much ice that she does not has an appetite for food.  She was started on iron pill 2 weeks ago, and prior to starting iron supplement, her bowel movement was normal, and she denied any abdominal pain, nausea, vomiting, hematochezia, or melena.  Since starting iron supplement, her stool has been black.  She denies any history of gastric surgery, such as bypass.  She eats a regular diet.  She denies any other symptoms of bleeding.  REVIEW OF SYSTEMS:   Constitutional: ( - ) fevers, ( - )  chills , ( - ) night sweats Eyes: ( - ) blurriness of vision, ( - ) double vision, ( - ) watery eyes Ears, nose, mouth, throat, and face: ( - ) mucositis, ( - ) sore throat Respiratory: ( - ) cough, ( - ) dyspnea, ( - ) wheezes Cardiovascular: ( - ) palpitation, ( - ) chest discomfort, ( - ) lower extremity swelling Gastrointestinal:  ( - ) nausea, ( - ) heartburn, ( + ) change in bowel habits Skin: ( - ) abnormal skin rashes Lymphatics: ( - ) new lymphadenopathy, ( - ) easy bruising Neurological: ( - ) numbness, ( - ) tingling, ( - ) new weaknesses Behavioral/Psych: ( - ) mood change, ( - ) new changes  All other systems were reviewed with the patient and are negative.  I have reviewed her chart and materials related to her cancer extensively and collaborated history with the patient. Summary of oncologic history is as follows: Oncology History   No history exists.    MEDICAL  HISTORY:  Past Medical History:  Diagnosis Date  . Anemia   . Anxiety   . Arthritis   . Cataract   . GERD (gastroesophageal reflux disease)   . H/O measles   . H/O mumps   . Heart murmur   . History of chicken pox   . Hyperlipidemia   . Hypertension   . Left shoulder pain 09/07/2017  . Osteoporosis   . Shingles   . Vitamin D deficiency 02/03/2016    SURGICAL HISTORY: Past Surgical History:  Procedure Laterality Date  . ABDOMINAL HYSTERECTOMY  1972  . APPENDECTOMY    . BLEPHAROPLASTY  2015  . CATARACT EXTRACTION     both eyes  .  COLONOSCOPY    . EYE SURGERY Bilateral 2013   b/l cataracts, By Dr Dolores Lory at [place]  . FOOT SURGERY    . TONSILLECTOMY AND ADENOIDECTOMY      SOCIAL HISTORY: Social History   Socioeconomic History  . Marital status: Married    Spouse name: Not on file  . Number of children: Not on file  . Years of education: Not on file  . Highest education level: Not on file  Occupational History  . Not on file  Social Needs  . Financial resource strain: Not on file  . Food insecurity    Worry: Not on file    Inability: Not on file  . Transportation needs    Medical: Not on file    Non-medical: Not on file  Tobacco Use  . Smoking status: Never Smoker  . Smokeless tobacco: Never Used  Substance and Sexual Activity  . Alcohol use: No    Alcohol/week: 0.0 standard drinks  . Drug use: No  . 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 Physical activity    Days per week: Not on file    Minutes per session: Not on file  . Stress: Not on file  Relationships  . Social Herbalist on phone: Not on file    Gets together: Not on file    Attends religious service: Not on file    Active member of club or organization: Not on file    Attends meetings of clubs or organizations: Not on file    Relationship status: Not on file  . Intimate partner violence    Fear of  current or ex partner: Not on file    Emotionally abused: Not on file    Physically abused: Not on file    Forced sexual activity: Not on file  Other Topics Concern  . Not on file  Social History Narrative  . Not on file    FAMILY HISTORY: Family History  Problem Relation Age of Onset  . Colon cancer Mother        died of colon ca  . Cancer Mother        breast, colon cancer  . Colon polyps Sister   . Colon polyps Sister   . Heart disease Father        died of heart attack  . Heart disease Maternal Grandmother   . Heart disease Maternal Grandfather   . Cancer Maternal Aunt        breast  . Cancer Maternal Aunt        breast  . Heart disease Brother        MI  . Asthma Paternal Uncle   . Esophageal cancer Neg Hx   . Rectal cancer Neg Hx   . Stomach cancer Neg Hx     ALLERGIES:  has No Known Allergies.  MEDICATIONS:  Current Outpatient Medications  Medication Sig Dispense Refill  . ALPRAZolam (XANAX) 0.25 MG tablet Take 1 tablet (0.25 mg total) by mouth 2 (two) times daily as needed for anxiety or sleep. 30 tablet 2  . atorvastatin (LIPITOR) 40 MG tablet TAKE 1 AND 1/2 TABLETS BY MOUTH DAILY 135 tablet 1  . Ferrous Fumarate (HEMOCYTE) 324 (106 Fe) MG TABS tablet Take 1 tablet (106 mg of iron total) by mouth daily. 30 tablet 3  . hydrochlorothiazide (HYDRODIURIL) 25 MG tablet Take 1 tablet (25 mg total)  by mouth daily. 90 tablet 1  . omeprazole (PRILOSEC) 40 MG capsule Take 1 capsule (40 mg total) by mouth daily. 90 capsule 1  . Vitamin D, Ergocalciferol, (DRISDOL) 50000 units CAPS capsule Take 1 capsule (50,000 Units total) by mouth every 7 (seven) days. 4 capsule 4   No current facility-administered medications for this visit.     PHYSICAL EXAMINATION: ECOG PERFORMANCE STATUS: 1 - Symptomatic but completely ambulatory  Vitals:   09/01/19 1449  BP: (!) 169/74  Pulse: 85  Resp: 19  Temp: (!) 96.9 F (36.1 C)  SpO2: 99%   Filed Weights   09/01/19 1449   Weight: 158 lb (71.7 kg)    GENERAL: alert, no distress and comfortable, slightly pale SKIN: skin color, texture, turgor are normal, no rashes or significant lesions EYES: conjunctiva are pink and non-injected, sclera clear OROPHARYNX: no exudate, no erythema; lips, buccal mucosa, and tongue normal  NECK: supple, non-tender LUNGS: clear to auscultation with normal breathing effort HEART: regular rate & rhythm, no murmurs, no lower extremity edema ABDOMEN: soft, non-tender, non-distended, normal bowel sounds Musculoskeletal: no cyanosis of digits and no clubbing  PSYCH: alert & oriented x 3, fluent speech NEURO: no focal motor/sensory deficits  LABORATORY DATA:  I have reviewed the data as listed Lab Results  Component Value Date   WBC 5.6 09/01/2019   HGB 9.5 (L) 09/01/2019   HCT 33.0 (L) 09/01/2019   MCV 72.2 (L) 09/01/2019   PLT 402 (H) 09/01/2019   Lab Results  Component Value Date   NA 140 09/01/2019   K 3.5 09/01/2019   CL 101 09/01/2019   CO2 28 09/01/2019    RADIOGRAPHIC STUDIES: I have personally reviewed the radiological images as listed and agreed with the findings in the report. No results found.  PATHOLOGY: I have reviewed the pathology reports as documented in the oncologist history.

## 2019-08-30 ENCOUNTER — Other Ambulatory Visit: Payer: Self-pay | Admitting: Hematology

## 2019-08-30 DIAGNOSIS — D509 Iron deficiency anemia, unspecified: Secondary | ICD-10-CM

## 2019-09-01 ENCOUNTER — Other Ambulatory Visit: Payer: Self-pay

## 2019-09-01 ENCOUNTER — Encounter: Payer: Self-pay | Admitting: Hematology

## 2019-09-01 ENCOUNTER — Inpatient Hospital Stay: Payer: Medicare Other | Attending: Hematology | Admitting: Hematology

## 2019-09-01 ENCOUNTER — Inpatient Hospital Stay: Payer: Medicare Other

## 2019-09-01 VITALS — BP 169/74 | HR 85 | Temp 96.9°F | Resp 19 | Ht 64.0 in | Wt 158.0 lb

## 2019-09-01 DIAGNOSIS — I10 Essential (primary) hypertension: Secondary | ICD-10-CM

## 2019-09-01 DIAGNOSIS — M818 Other osteoporosis without current pathological fracture: Secondary | ICD-10-CM

## 2019-09-01 DIAGNOSIS — D473 Essential (hemorrhagic) thrombocythemia: Secondary | ICD-10-CM

## 2019-09-01 DIAGNOSIS — Z79899 Other long term (current) drug therapy: Secondary | ICD-10-CM | POA: Diagnosis not present

## 2019-09-01 DIAGNOSIS — D509 Iron deficiency anemia, unspecified: Secondary | ICD-10-CM

## 2019-09-01 LAB — CMP (CANCER CENTER ONLY)
ALT: 13 U/L (ref 0–44)
AST: 15 U/L (ref 15–41)
Albumin: 4.7 g/dL (ref 3.5–5.0)
Alkaline Phosphatase: 103 U/L (ref 38–126)
Anion gap: 11 (ref 5–15)
BUN: 16 mg/dL (ref 8–23)
CO2: 28 mmol/L (ref 22–32)
Calcium: 9.8 mg/dL (ref 8.9–10.3)
Chloride: 101 mmol/L (ref 98–111)
Creatinine: 0.73 mg/dL (ref 0.44–1.00)
GFR, Est AFR Am: >60 mL/min (ref >60)
GFR, Estimated: >60 mL/min (ref >60)
Glucose, Bld: 123 mg/dL — ABNORMAL HIGH (ref 70–99)
Potassium: 3.5 mmol/L (ref 3.5–5.1)
Sodium: 140 mmol/L (ref 135–145)
Total Bilirubin: 0.5 mg/dL (ref 0.3–1.2)
Total Protein: 7.2 g/dL (ref 6.5–8.1)

## 2019-09-01 LAB — CBC WITH DIFFERENTIAL (CANCER CENTER ONLY)
Abs Immature Granulocytes: 0.02 10*3/uL (ref 0.00–0.07)
Basophils Absolute: 0 10*3/uL (ref 0.0–0.1)
Basophils Relative: 1 %
Eosinophils Absolute: 0 10*3/uL (ref 0.0–0.5)
Eosinophils Relative: 1 %
HCT: 33 % — ABNORMAL LOW (ref 36.0–46.0)
Hemoglobin: 9.5 g/dL — ABNORMAL LOW (ref 12.0–15.0)
Immature Granulocytes: 0 %
Lymphocytes Relative: 23 %
Lymphs Abs: 1.3 10*3/uL (ref 0.7–4.0)
MCH: 20.8 pg — ABNORMAL LOW (ref 26.0–34.0)
MCHC: 28.8 g/dL — ABNORMAL LOW (ref 30.0–36.0)
MCV: 72.2 fL — ABNORMAL LOW (ref 80.0–100.0)
Monocytes Absolute: 0.5 10*3/uL (ref 0.1–1.0)
Monocytes Relative: 9 %
Neutro Abs: 3.7 10*3/uL (ref 1.7–7.7)
Neutrophils Relative %: 66 %
Platelet Count: 402 10*3/uL — ABNORMAL HIGH (ref 150–400)
RBC: 4.57 MIL/uL (ref 3.87–5.11)
RDW: 21.4 % — ABNORMAL HIGH (ref 11.5–15.5)
WBC Count: 5.6 10*3/uL (ref 4.0–10.5)
nRBC: 0 % (ref 0.0–0.2)

## 2019-09-01 LAB — SAVE SMEAR (SSMR)

## 2019-09-02 LAB — IRON AND TIBC
Iron: 23 ug/dL — ABNORMAL LOW (ref 41–142)
Saturation Ratios: 5 % — ABNORMAL LOW (ref 21–57)
TIBC: 444 ug/dL (ref 236–444)
UIBC: 421 ug/dL — ABNORMAL HIGH (ref 120–384)

## 2019-09-02 LAB — FERRITIN: Ferritin: 13 ng/mL (ref 11–307)

## 2019-09-05 ENCOUNTER — Telehealth: Payer: Self-pay

## 2019-09-05 NOTE — Telephone Encounter (Signed)
Patient has follow up with Nicoletta Ba PA on 09/12/19.  Patient does not wish to schedule directly.  She wants to discuss capsule instead of endo/colon.  She will keep her appt on Monday 10/19 with Amy Berwick PA

## 2019-09-05 NOTE — Telephone Encounter (Signed)
-----   Message from Ladene Artist, MD sent at 09/04/2019  4:45 PM EDT ----- Regarding: RE: Iron deficiency anemia Hi,   Yes a repeat evaluation is warranted: likely colonoscopy, EGD and if no source then a capsule endoscopy. My office will contact her to set up the evaluation.   Thank you,  Norberto Sorenson ----- Message ----- From: Tish Men, MD Sent: 09/01/2019   3:28 PM EDT To: Ladene Artist, MD, Mosie Lukes, MD Subject: Iron deficiency anemia                         Hi Dr. Fuller Plan,  How are you? I am a hematologist at [facility] and saw Ms. Dorning for iron deficiency anemia. She had colonoscopy in 2017 and EGD in 2019 that did not show any source of bleeding, but she has had several relatively acute drops in Hgb over the past 2 years, including most recently with Hgb in the 8's with iron deficiency, concerning for occult GI bleeding.   I just want to see if you would consider any repeat work-up to rule out GI bleeding, such as colonoscopy or capsule endoscopy? I do not think there is any primary hematologic disorder at this time.  Thank you for your help.  Vedia Coffer

## 2019-09-09 ENCOUNTER — Other Ambulatory Visit: Payer: Self-pay

## 2019-09-09 ENCOUNTER — Inpatient Hospital Stay: Payer: Medicare Other

## 2019-09-09 VITALS — BP 147/56 | HR 78 | Temp 97.8°F | Resp 20

## 2019-09-09 DIAGNOSIS — I10 Essential (primary) hypertension: Secondary | ICD-10-CM | POA: Diagnosis not present

## 2019-09-09 DIAGNOSIS — Z79899 Other long term (current) drug therapy: Secondary | ICD-10-CM | POA: Diagnosis not present

## 2019-09-09 DIAGNOSIS — D509 Iron deficiency anemia, unspecified: Secondary | ICD-10-CM | POA: Diagnosis not present

## 2019-09-09 DIAGNOSIS — D473 Essential (hemorrhagic) thrombocythemia: Secondary | ICD-10-CM | POA: Diagnosis not present

## 2019-09-09 MED ORDER — SODIUM CHLORIDE 0.9 % IV SOLN
510.0000 mg | Freq: Once | INTRAVENOUS | Status: AC
  Administered 2019-09-09: 510 mg via INTRAVENOUS
  Filled 2019-09-09: qty 17

## 2019-09-09 MED ORDER — SODIUM CHLORIDE 0.9 % IV SOLN
Freq: Once | INTRAVENOUS | Status: AC
  Administered 2019-09-09: 13:00:00 via INTRAVENOUS
  Filled 2019-09-09: qty 250

## 2019-09-09 NOTE — Patient Instructions (Signed)

## 2019-09-12 ENCOUNTER — Ambulatory Visit (INDEPENDENT_AMBULATORY_CARE_PROVIDER_SITE_OTHER): Payer: Medicare Other | Admitting: Physician Assistant

## 2019-09-12 ENCOUNTER — Encounter: Payer: Self-pay | Admitting: Physician Assistant

## 2019-09-12 ENCOUNTER — Other Ambulatory Visit: Payer: Self-pay

## 2019-09-12 ENCOUNTER — Other Ambulatory Visit (INDEPENDENT_AMBULATORY_CARE_PROVIDER_SITE_OTHER): Payer: Medicare Other

## 2019-09-12 VITALS — BP 140/77 | HR 82 | Temp 97.6°F | Ht 64.0 in | Wt 161.9 lb

## 2019-09-12 DIAGNOSIS — R0602 Shortness of breath: Secondary | ICD-10-CM | POA: Diagnosis not present

## 2019-09-12 DIAGNOSIS — D509 Iron deficiency anemia, unspecified: Secondary | ICD-10-CM

## 2019-09-12 DIAGNOSIS — Z86010 Personal history of colonic polyps: Secondary | ICD-10-CM

## 2019-09-12 DIAGNOSIS — K219 Gastro-esophageal reflux disease without esophagitis: Secondary | ICD-10-CM | POA: Diagnosis not present

## 2019-09-12 DIAGNOSIS — R5383 Other fatigue: Secondary | ICD-10-CM | POA: Diagnosis not present

## 2019-09-12 LAB — CBC
HCT: 32.6 % — ABNORMAL LOW (ref 36.0–46.0)
Hemoglobin: 10 g/dL — ABNORMAL LOW (ref 12.0–15.0)
MCHC: 30.8 g/dL (ref 30.0–36.0)
MCV: 70.5 fl — ABNORMAL LOW (ref 78.0–100.0)
Platelets: 299 10*3/uL (ref 150.0–400.0)
RBC: 4.62 Mil/uL (ref 3.87–5.11)
RDW: 22.6 % — ABNORMAL HIGH (ref 11.5–15.5)
WBC: 5.6 10*3/uL (ref 4.0–10.5)

## 2019-09-12 MED ORDER — NA SULFATE-K SULFATE-MG SULF 17.5-3.13-1.6 GM/177ML PO SOLN: ORAL | 0 refills | Status: DC

## 2019-09-12 NOTE — Progress Notes (Signed)
Reviewed and agree with management plan.  Sonia Stickels T. Kathrynne Kulinski, MD FACG [facility]  

## 2019-09-12 NOTE — Progress Notes (Signed)
Subjective:    Patient ID: Isabella Andersen, female    DOB: [birth year], 78 y.o.   MRN: [id number]  HPI Isabella Andersen is a pleasant 78 year old white female, known to Dr. Fuller Plan who is referred back today by Dr. Maryellen Pile for iron deficiency anemia.  Patient has history of hypertension, chronic GERD, hyperlipidemia and osteoporosis. She has personal history of adenomatous colon polyps and last had colonoscopy in 2017 which was a normal exam. She had EGD in July 2019 which revealed a large hiatal hernia, she had several polyps removed including an 8 mm pedunculated polyp from the stomach.  Biopsy showed fundic gland polyps and a hyperplastic polyp.  Positive family history of colon cancer in her mother age 43 deceased.  Patient does have previous history of iron deficiency anemia with labs from May 2019 showing hemoglobin 9.6 hematocrit of 30 MCV of 69 and a ferritin of 4.  In July 2019 hemoglobin was 11.9. August 2020 hemoglobin 8.5 MCV of 65. 08/17/2019 hemoglobin 8.4 hematocrit of 28, MCV 67, ferritin 6.2.  Patient says she did do Hemoccults but those have not resulted in her labs.  Patient relates she had a Feraheme infusion last Friday and is setting up for a second infusion later this week.  She says she has been feeling terrible over the past couple of months very fatigued and short of breath with exertion.  She has had a couple of episodes of "a nagging" discomfort along the lateral chest wall none over the past 2 days.  She climbed the stairs to our office today and said she was very short of breath and had some discomfort in her mid chest that radiated to her back.  Her appetite has been decreased recently, she said she had been craving ice.  She is on omeprazole chronically for GERD which generally controls her symptoms.  She had been trying to take oral iron which was making her very nauseated and she has not had it over the past week.  Bowel movements dark since starting iron, prior to that no melena  or hematochezia and bowel movements had been regular. She has had some vague upper abdominal discomfort recently.  No regular NSAIDs  Review of Systems Pertinent positive and negative review of systems were noted in the above HPI section.  All other review of systems was otherwise negative.  Outpatient Encounter Medications as of 09/12/2019  Medication Sig   ALPRAZolam (XANAX) 0.25 MG tablet Take 1 tablet (0.25 mg total) by mouth 2 (two) times daily as needed for anxiety or sleep.   atorvastatin (LIPITOR) 40 MG tablet TAKE 1 AND 1/2 TABLETS BY MOUTH DAILY   hydrochlorothiazide (HYDRODIURIL) 25 MG tablet Take 1 tablet (25 mg total) by mouth daily.   omeprazole (PRILOSEC) 40 MG capsule Take 1 capsule (40 mg total) by mouth daily.   Vitamin D, Ergocalciferol, (DRISDOL) 50000 units CAPS capsule Take 1 capsule (50,000 Units total) by mouth every 7 (seven) days.   Ferrous Fumarate (HEMOCYTE) 324 (106 Fe) MG TABS tablet Take 1 tablet (106 mg of iron total) by mouth daily. (Patient not taking: Reported on 09/12/2019)   Na Sulfate-K Sulfate-Mg Sulf 17.5-3.13-1.6 GM/177ML SOLN Suprep-Use as directed   No facility-administered encounter medications on file as of 09/12/2019.    No Known Allergies Patient Active Problem List   Diagnosis Date Noted   Right hip pain 09/07/2018   Iron deficiency anemia 03/15/2018   Left shoulder pain 09/07/2017   Dyspepsia 02/03/2016   Vitamin D deficiency  02/03/2016   History of chicken pox    Insomnia 12/22/2014   Left thigh pain 08/22/2014   Allergic rhinitis 03/02/2014   Cough 09/21/2013   Murmur 09/16/2012   Chest pain 08/24/2012   Anxiety and depression 07/14/2012   Medicare annual wellness visit, subsequent 10/22/2011   Osteoporosis 09/09/2011   GERD (gastroesophageal reflux disease)    Hyperlipidemia    Hypertension    Social History   Socioeconomic History   Marital status: Married    Spouse name: Not on file   Number  of children: Not on file   Years of education: Not on file   Highest education level: Not on file  Occupational History   Not on file  Social Needs   Financial resource strain: Not on file   Food insecurity    Worry: Not on file    Inability: Not on file   Transportation needs    Medical: Not on file    Non-medical: Not on file  Tobacco Use   Smoking status: Never Smoker   Smokeless tobacco: Never Used  Substance and Sexual Activity   Alcohol use: No    Alcohol/week: 0.0 standard drinks   Drug use: No   Sexual activity: Not Currently    Comment: lives with husband, works as Network engineer, no dietary restrictions  Lifestyle   Physical activity    Days per week: Not on file    Minutes per session: Not on file   Stress: Not on file  Relationships   Social connections    Talks on phone: Not on file    Gets together: Not on file    Attends religious service: Not on file    Active member of club or organization: Not on file    Attends meetings of clubs or organizations: Not on file    Relationship status: Not on file   Intimate partner violence    Fear of current or ex partner: Not on file    Emotionally abused: Not on file    Physically abused: Not on file    Forced sexual activity: Not on file  Other Topics Concern   Not on file  Social History Narrative   Not on file    Ms. Mote's family history includes Asthma in her paternal uncle; Cancer in her maternal aunt, maternal aunt, and mother; Colon cancer in her mother; Colon polyps in her sister and sister; Heart disease in her brother, father, maternal grandfather, and maternal grandmother.      Objective:    Vitals:   09/12/19 1019  BP: 140/77  Pulse: 82  Temp: 97.6 F (36.4 C)  SpO2: 98%    Physical Exam;Well-developed well-nourished elderly white female in no acute distress.  Height, Weight 161, BMI 27.7  HEENT; nontraumatic normocephalic, EOMI, PER R LA, sclera anicteric. Oropharynx; not  examined/mask/Covid Neck; supple, no JVD Cardiovascular; regular rate and rhythm with S1-S2, no murmur rub or gallop Pulmonary; Clear bilaterally Abdomen; soft, nontender, nondistended, no palpable mass or hepatosplenomegaly, bowel sounds are active  Rectal; brown stool heme-negative Skin; benign exam, no jaundice rash or appreciable lesions Extremities; no clubbing cyanosis or edema skin warm and dry Neuro/Psych; alert and oriented x4, grossly nonfocal mood and affect appropriate       Assessment & Plan:   #4 78 year old white female with recurrent iron deficiency anemia, symptomatic with fatigue and exertional dyspnea, some vague chest pains. Stool heme-negative today, cannot rule out intermittent subacute and/or chronic GI blood loss. Rule out occult  colon lesion, rule out possible AVMs, rule out Cameron erosions with known large hiatal hernia.  #2 personal history of adenomatous colon polyps-last colonoscopy 2017 #3 family history of colon cancer in patient's mother age 59s #4 history of hypertension 5.  Chronic GERD #6 hyperlipidemia  Plan; stat CBC today-this resulted showing hemoglobin up to 10 Patient advised to limit her activity for now Continue omeprazole 40 mg p.o. every morning Proceed with second Feraheme infusion later this week Patient will be scheduled for colonoscopy and EGD with Dr. Fuller Plan next week.  Both procedures were discussed in detail with the patient including indications risks and benefits and she is agreeable to proceed. If endoscopic evaluation is unremarkable proceed to capsule endoscopy which was also discussed with patient today. She will stop oral iron for now which was making her very nauseated Patient advised that if she has any further or prolonged chest pains to proceed to the emergency room for further evaluation.  Cayley Pester Genia Harold PA-C 09/12/2019   Cc: Tish Men, MD

## 2019-09-12 NOTE — Patient Instructions (Signed)
If you are age 76 or older, your body mass index should be between 23-30. Your Body mass index is 27.79 kg/m. If this is out of the aforementioned range listed, please consider follow up with your Primary Care Provider.  If you are age 68 or younger, your body mass index should be between 19-25. Your Body mass index is 27.79 kg/m. If this is out of the aformentioned range listed, please consider follow up with your Primary Care Provider.   You have been scheduled for an endoscopy and colonoscopy. Please follow the written instructions given to you at your visit today. Please pick up your prep supplies at the pharmacy within the next 1-3 days. If you use inhalers (even only as needed), please bring them with you on the day of your procedure. Your physician has requested that you go to www.startemmi.com and enter the access code given to you at your visit today. This web site gives a general overview about your procedure. However, you should still follow specific instructions given to you by our office regarding your preparation for the procedure.  We have sent the following medications to your pharmacy for you to pick up at your convenience: [place] provider has requested lab work before leaving today. CBC   Continue Omeprazole 40 mg daily.  Okay to STOP oral Iron.  Thank you for choosing me and [facility].   Amy Esterwood, PA-C

## 2019-09-15 ENCOUNTER — Inpatient Hospital Stay: Payer: Medicare Other

## 2019-09-15 ENCOUNTER — Other Ambulatory Visit: Payer: Self-pay

## 2019-09-15 VITALS — BP 154/61 | HR 79 | Temp 97.5°F | Resp 18

## 2019-09-15 DIAGNOSIS — D509 Iron deficiency anemia, unspecified: Secondary | ICD-10-CM

## 2019-09-15 DIAGNOSIS — I10 Essential (primary) hypertension: Secondary | ICD-10-CM | POA: Diagnosis not present

## 2019-09-15 DIAGNOSIS — D473 Essential (hemorrhagic) thrombocythemia: Secondary | ICD-10-CM | POA: Diagnosis not present

## 2019-09-15 DIAGNOSIS — Z79899 Other long term (current) drug therapy: Secondary | ICD-10-CM | POA: Diagnosis not present

## 2019-09-15 MED ORDER — SODIUM CHLORIDE 0.9 % IV SOLN
510.0000 mg | Freq: Once | INTRAVENOUS | Status: AC
  Administered 2019-09-15: 13:00:00 510 mg via INTRAVENOUS
  Filled 2019-09-15: qty 510

## 2019-09-15 MED ORDER — SODIUM CHLORIDE 0.9 % IV SOLN
Freq: Once | INTRAVENOUS | Status: AC
  Administered 2019-09-15: 13:00:00 via INTRAVENOUS
  Filled 2019-09-15: qty 250

## 2019-09-15 NOTE — Patient Instructions (Signed)

## 2019-09-20 ENCOUNTER — Telehealth: Payer: Self-pay

## 2019-09-20 NOTE — Telephone Encounter (Signed)
Covid-19 screening questions   Do you now or have you had a fever in the last 14 days? NO   Do you have any respiratory symptoms of shortness of breath or cough now or in the last 14 days? NO  Do you have any family members or close contacts with diagnosed or suspected Covid-19 in the past 14 days? NO  Have you been tested for Covid-19 and found to be positive? NO        

## 2019-09-21 ENCOUNTER — Ambulatory Visit (AMBULATORY_SURGERY_CENTER): Payer: Medicare Other | Admitting: Gastroenterology

## 2019-09-21 ENCOUNTER — Encounter: Payer: Self-pay | Admitting: Gastroenterology

## 2019-09-21 ENCOUNTER — Other Ambulatory Visit: Payer: Self-pay

## 2019-09-21 VITALS — BP 164/64 | HR 70 | Temp 97.7°F | Resp 18 | Ht 64.0 in | Wt 161.0 lb

## 2019-09-21 DIAGNOSIS — K449 Diaphragmatic hernia without obstruction or gangrene: Secondary | ICD-10-CM | POA: Diagnosis not present

## 2019-09-21 DIAGNOSIS — K219 Gastro-esophageal reflux disease without esophagitis: Secondary | ICD-10-CM

## 2019-09-21 DIAGNOSIS — K317 Polyp of stomach and duodenum: Secondary | ICD-10-CM | POA: Diagnosis not present

## 2019-09-21 DIAGNOSIS — D509 Iron deficiency anemia, unspecified: Secondary | ICD-10-CM

## 2019-09-21 DIAGNOSIS — D124 Benign neoplasm of descending colon: Secondary | ICD-10-CM

## 2019-09-21 DIAGNOSIS — Z86010 Personal history of colonic polyps: Secondary | ICD-10-CM

## 2019-09-21 DIAGNOSIS — Z800 Family history of malignant neoplasm of digestive organs: Secondary | ICD-10-CM

## 2019-09-21 MED ORDER — SODIUM CHLORIDE 0.9 % IV SOLN: 500.0000 mL | Freq: Once | INTRAVENOUS | Status: DC

## 2019-09-21 NOTE — Progress Notes (Signed)
Temperature- June Bullock VS- Courtney Washington 

## 2019-09-21 NOTE — Progress Notes (Signed)
Called to room to assist during endoscopic procedure.  Patient ID and intended procedure confirmed with present staff. Received instructions for my participation in the procedure from the performing physician.  

## 2019-09-21 NOTE — Op Note (Signed)
[name] Procedure Date: 09/21/2019 2:48 PM MRN: [id number] Endoscopist: Ladene Artist , MD Age: 78 Referring MD:  Date of Birth: [birth year] Gender: Female Account #: [id number] Procedure:                Upper GI endoscopy Indications:              Iron deficiency anemia, Gastroesophageal reflux                            disease Medicines:                Monitored Anesthesia Care Procedure:                Pre-Anesthesia Assessment:                           - Prior to the procedure, a History and Physical                            was performed, and patient medications and                            allergies were reviewed. The patient's tolerance of                            previous anesthesia was also reviewed. The risks                            and benefits of the procedure and the sedation                            options and risks were discussed with the patient.                            All questions were answered, and informed consent                            was obtained. Prior Anticoagulants: The patient has                            taken no previous anticoagulant or antiplatelet                            agents. ASA Grade Assessment: II - A patient with                            mild systemic disease. After reviewing the risks                            and benefits, the patient was deemed in                            satisfactory condition to undergo the procedure.  After obtaining informed consent, the endoscope was                            passed under direct vision. Throughout the                            procedure, the patient's blood pressure, pulse, and                            oxygen saturations were monitored continuously. The                            Endoscope was introduced through the mouth, and                            advanced to the second part of duodenum. The  upper                            GI endoscopy was accomplished without difficulty.                            The patient tolerated the procedure well. Scope In: Scope Out: Findings:                 The examined esophagus was normal. Z-line at 32 cm.                           Multiple small sessile polyps with no bleeding and                            no stigmata of recent bleeding were found in the                            gastric fundus and in the gastric body. Gastric                            polyps were previously biopsied.                           A large hiatal hernia was present. No Cameron                            erosions noted.                           The exam of the stomach was otherwise normal.                           The duodenal bulb and second portion of the                            duodenum were normal. Biopsies for histology were  taken with a cold forceps for evaluation of celiac                            disease. Complications:            No immediate complications. Estimated Blood Loss:     Estimated blood loss was minimal. Impression:               - Normal esophagus.                           - Multiple gastric polyps.                           - Large hiatal hernia.                           - Normal duodenal bulb and second portion of the                            duodenum. Biopsied. Recommendation:           - Patient has a contact number available for                            emergencies. The signs and symptoms of potential                            delayed complications were discussed with the                            patient. Return to normal activities tomorrow.                            Written discharge instructions were provided to the                            patient.                           - Resume previous diet.                           - Antireflux measures.                           - Continue  present medications.                           - Await pathology results.                           - Consider VCE if iron deficiency is recurrent.                           - Return to GI office in 6 weeks. Ladene Artist, MD 09/21/2019 3:29:23 PM This report has been signed electronically.

## 2019-09-21 NOTE — Patient Instructions (Addendum)
Handouts given for hiatal hernia, GERD, and polyps.  Office will call you to make 6 week appointment.  YOU HAD AN ENDOSCOPIC PROCEDURE TODAY AT [facility]:   Refer to the procedure report that was given to you for any specific questions about what was found during the examination.  If the procedure report does not answer your questions, please call your gastroenterologist to clarify.  If you requested that your care partner not be given the details of your procedure findings, then the procedure report has been included in a sealed envelope for you to review at your convenience later.  YOU SHOULD EXPECT: Some feelings of bloating in the abdomen. Passage of more gas than usual.  Walking can help get rid of the air that was put into your GI tract during the procedure and reduce the bloating. If you had a lower endoscopy (such as a colonoscopy or flexible sigmoidoscopy) you may notice spotting of blood in your stool or on the toilet paper. If you underwent a bowel prep for your procedure, you may not have a normal bowel movement for a few days.  Please Note:  You might notice some irritation and congestion in your nose or some drainage.  This is from the oxygen used during your procedure.  There is no need for concern and it should clear up in a day or so.  SYMPTOMS TO REPORT IMMEDIATELY:   Following lower endoscopy (colonoscopy or flexible sigmoidoscopy):  Excessive amounts of blood in the stool  Significant tenderness or worsening of abdominal pains  Swelling of the abdomen that is new, acute  Fever of 100F or higher   Following upper endoscopy (EGD)  Vomiting of blood or coffee ground material  New chest pain or pain under the shoulder blades  Painful or persistently difficult swallowing  New shortness of breath  Fever of 100F or higher  Black, tarry-looking stools  For urgent or emergent issues, a gastroenterologist can be reached at any hour by calling [phone]  [phone].   DIET:  We do recommend a small meal at first, but then you may proceed to your regular diet.  Drink plenty of fluids but you should avoid alcoholic beverages for 24 hours.  ACTIVITY:  You should plan to take it easy for the rest of today and you should NOT DRIVE or use heavy machinery until tomorrow (because of the sedation medicines used during the test).    FOLLOW UP: Our staff will call the number listed on your records 48-72 hours following your procedure to check on you and address any questions or concerns that you may have regarding the information given to you following your procedure. If we do not reach you, we will leave a message.  We will attempt to reach you two times.  During this call, we will ask if you have developed any symptoms of COVID 19. If you develop any symptoms (ie: fever, flu-like symptoms, shortness of breath, cough etc.) before then, please call [phone].  If you test positive for Covid 19 in the 2 weeks post procedure, please call and report this information to [place].    If any biopsies were taken you will be contacted by phone or by letter within the next 1-3 weeks.  Please call us at [phone] if you have not heard about the biopsies in 3 weeks.    SIGNATURES/CONFIDENTIALITY: You and/or your care partner have signed paperwork which will be entered into your electronic medical record.  These signatures  attest to the fact that that the information above on your After Visit Summary has been reviewed and is understood.  Full responsibility of the confidentiality of this discharge information lies with you and/or your care-partner.

## 2019-09-21 NOTE — Progress Notes (Signed)
PT taken to PACU. Monitors in place. VSS. Report given to RN. 

## 2019-09-21 NOTE — Op Note (Signed)
[name] Procedure Date: 09/21/2019 2:49 PM MRN: [id number] Endoscopist: Ladene Artist , MD Age: 78 Referring MD:  Date of Birth: 05-Aug-1941 Gender: Female Account #: [id number] Procedure:                Colonoscopy Indications:              Iron deficiency anemia. Personal history of                            adenomatous colon polyps. Family history of colon                            cancer. Medicines:                Monitored Anesthesia Care Procedure:                Pre-Anesthesia Assessment:                           - Prior to the procedure, a History and Physical                            was performed, and patient medications and                            allergies were reviewed. The patient's tolerance of                            previous anesthesia was also reviewed. The risks                            and benefits of the procedure and the sedation                            options and risks were discussed with the patient.                            All questions were answered, and informed consent                            was obtained. Prior Anticoagulants: The patient has                            taken no previous anticoagulant or antiplatelet                            agents. ASA Grade Assessment: II - A patient with                            mild systemic disease. After reviewing the risks                            and benefits, the patient was deemed in  satisfactory condition to undergo the procedure.                           After obtaining informed consent, the colonoscope                            was passed under direct vision. Throughout the                            procedure, the patient's blood pressure, pulse, and                            oxygen saturations were monitored continuously. The                            Colonoscope was introduced through the anus and                      advanced to the the cecum, identified by                            appendiceal orifice and ileocecal valve. The                            ileocecal valve, appendiceal orifice, and rectum                            were photographed. The quality of the bowel                            preparation was excellent. The colonoscopy was                            performed without difficulty. The patient tolerated                            the procedure well. Scope In: 3:03:49 PM Scope Out: 3:14:47 PM Scope Withdrawal Time: 0 hours 8 minutes 46 seconds  Total Procedure Duration: 0 hours 10 minutes 58 seconds  Findings:                 The perianal and digital rectal examinations were                            normal.                           A 6 mm polyp was found in the descending colon. The                            polyp was sessile. The polyp was removed with a                            cold snare. Resection and retrieval were complete.  The exam was otherwise without abnormality on                            direct and retroflexion views. Complications:            No immediate complications. Estimated blood loss:                            None. Estimated Blood Loss:     Estimated blood loss: none. Impression:               - One 6 mm polyp in the descending colon, removed                            with a cold snare. Resected and retrieved.                           - The examination was otherwise normal on direct                            and retroflexion views. Recommendation:           - Patient has a contact number available for                            emergencies. The signs and symptoms of potential                            delayed complications were discussed with the                            patient. Return to normal activities tomorrow.                            Written discharge instructions were provided to the                             patient.                           - Resume previous diet.                           - Continue present medications.                           - Await pathology results.                           - No repeat colonoscopy due to age. Ladene Artist, MD 09/21/2019 3:25:05 PM This report has been signed electronically.

## 2019-09-23 ENCOUNTER — Telehealth: Payer: Self-pay

## 2019-09-23 NOTE — Telephone Encounter (Signed)
  Follow up Call-  Call back number 09/21/2019 06/10/2018  Post procedure Call Back phone  # [phone]  Permission to leave phone message Yes Yes  Some recent data might be hidden     Patient questions:  Do you have a fever, pain , or abdominal swelling? No. Pain Score  0 *  Have you tolerated food without any problems? Yes.    Have you been able to return to your normal activities? Yes.    Do you have any questions about your discharge instructions: Diet   No. Medications  No. Follow up visit  No.  Do you have questions or concerns about your Care? No.  Actions: * If pain score is 4 or above: No action needed, pain <4. 1. Have you developed a fever since your procedure? no  2.   Have you had an respiratory symptoms (SOB or cough) since your procedure? no  3.   Have you tested positive for COVID 19 since your procedure no  4.   Have you had any family members/close contacts diagnosed with the COVID 19 since your procedure?  no   If yes to any of these questions please route to Joylene John, RN and Alphonsa Gin, Therapist, sports.

## 2019-09-29 ENCOUNTER — Encounter: Payer: Self-pay | Admitting: Gastroenterology

## 2019-11-03 ENCOUNTER — Inpatient Hospital Stay (HOSPITAL_BASED_OUTPATIENT_CLINIC_OR_DEPARTMENT_OTHER): Payer: Medicare Other | Admitting: Hematology

## 2019-11-03 ENCOUNTER — Inpatient Hospital Stay: Payer: Medicare Other | Attending: Hematology

## 2019-11-03 ENCOUNTER — Other Ambulatory Visit: Payer: Self-pay

## 2019-11-03 ENCOUNTER — Encounter: Payer: Self-pay | Admitting: Hematology

## 2019-11-03 VITALS — BP 154/62 | HR 94 | Temp 98.2°F | Resp 18 | Ht 64.0 in | Wt 161.4 lb

## 2019-11-03 DIAGNOSIS — Z79899 Other long term (current) drug therapy: Secondary | ICD-10-CM | POA: Diagnosis not present

## 2019-11-03 DIAGNOSIS — D508 Other iron deficiency anemias: Secondary | ICD-10-CM | POA: Diagnosis not present

## 2019-11-03 DIAGNOSIS — D509 Iron deficiency anemia, unspecified: Secondary | ICD-10-CM | POA: Insufficient documentation

## 2019-11-03 LAB — CMP (CANCER CENTER ONLY)
ALT: 22 U/L (ref 0–44)
AST: 17 U/L (ref 15–41)
Albumin: 4.9 g/dL (ref 3.5–5.0)
Alkaline Phosphatase: 93 U/L (ref 38–126)
Anion gap: 10 (ref 5–15)
BUN: 10 mg/dL (ref 8–23)
CO2: 26 mmol/L (ref 22–32)
Calcium: 9.4 mg/dL (ref 8.9–10.3)
Chloride: 107 mmol/L (ref 98–111)
Creatinine: 0.74 mg/dL (ref 0.44–1.00)
GFR, Est AFR Am: >60 mL/min (ref >60)
GFR, Estimated: >60 mL/min (ref >60)
Glucose, Bld: 120 mg/dL — ABNORMAL HIGH (ref 70–99)
Potassium: 3.6 mmol/L (ref 3.5–5.1)
Sodium: 143 mmol/L (ref 135–145)
Total Bilirubin: 0.3 mg/dL (ref 0.3–1.2)
Total Protein: 7 g/dL (ref 6.5–8.1)

## 2019-11-03 LAB — CBC WITH DIFFERENTIAL (CANCER CENTER ONLY)
Abs Immature Granulocytes: 0.01 10*3/uL (ref 0.00–0.07)
Basophils Absolute: 0 10*3/uL (ref 0.0–0.1)
Basophils Relative: 1 %
Eosinophils Absolute: 0.1 10*3/uL (ref 0.0–0.5)
Eosinophils Relative: 3 %
HCT: 41.4 % (ref 36.0–46.0)
Hemoglobin: 13.3 g/dL (ref 12.0–15.0)
Immature Granulocytes: 0 %
Lymphocytes Relative: 28 %
Lymphs Abs: 1.4 10*3/uL (ref 0.7–4.0)
MCH: 26.5 pg (ref 26.0–34.0)
MCHC: 32.1 g/dL (ref 30.0–36.0)
MCV: 82.6 fL (ref 80.0–100.0)
Monocytes Absolute: 0.4 10*3/uL (ref 0.1–1.0)
Monocytes Relative: 9 %
Neutro Abs: 2.9 10*3/uL (ref 1.7–7.7)
Neutrophils Relative %: 59 %
Platelet Count: 272 10*3/uL (ref 150–400)
RBC: 5.01 MIL/uL (ref 3.87–5.11)
RDW: 21.3 % — ABNORMAL HIGH (ref 11.5–15.5)
WBC Count: 4.8 10*3/uL (ref 4.0–10.5)
nRBC: 0 % (ref 0.0–0.2)

## 2019-11-03 LAB — SAVE SMEAR(SSMR), FOR PROVIDER SLIDE REVIEW

## 2019-11-03 NOTE — Progress Notes (Signed)
[facility] OFFICE PROGRESS NOTE  Patient Care Team: Mosie Lukes, MD as PCP - General (Family Medicine) Druscilla Brownie, MD as Consulting Physician (Dermatology)  HEME/ONC OVERVIEW: 1. Iron deficiency anemia -Possibly due to decreased absorption vs insufficient dietary intake  -Hgb mid-8's in 07/2019 (11 in 2019); normalized with IV iron   A few sessile gastric polyps without bleeding; one tubular adenoma in the colon in 08/2019   TREATMENT SUMMARY:  PRN IV iron, last in 08/2019   ASSESSMENT & PLAN:   Iron deficiency anemia -Possibly due to decreased absorption vs insufficient dietary intake  -EGD and colonoscopy in 08/2019 did not show any evidence of bleeding or suspicious lesions -S/p IV iron x 2 in 08/2019; patient tolerated it well without any side effects -Hgb 13.3 with normalization of MCV and RDW -I encouraged patient to continue prenatal vitamin daily, which contains higher content of folic acid, iron and Ca than OTC multivitamin -We will monitor her iron profile periodically  -Continue follow-up with gastroenterology for any further work-up to rule out GI bleeding as needed -If she experiences any symptoms of recurrent iron deficiency anemia, such as craving for ice, progressive fatigue, she is instructed to contact the clinic for further evaluation   Orders Placed This Encounter  Procedures  . CBC w/ diff    Standing Status:   Future    Standing Expiration Date:   12/07/2020  . CMP    Standing Status:   Future    Standing Expiration Date:   12/07/2020  . Ferritin    Standing Status:   Future    Standing Expiration Date:   12/07/2020  . Iron and TIBC    Standing Status:   Future    Standing Expiration Date:   12/07/2020   All questions were answered. The patient knows to call the clinic with any problems, questions or concerns. No barriers to learning was detected.  A total of more than 15 minutes were spent face-to-face with the patient during  this encounter and over half of that time was spent on counseling and coordination of care as outlined above.   Return in 6 months for labs and clinic follow-up.   Isabella Men, MD 11/03/2019 11:01 AM  CHIEF COMPLAINT: "I am feeling much better"  INTERVAL HISTORY: Isabella Andersen returns to clinic for follow-up of iron deficiency anemia, possibly due to decreased oral intake versus malabsorption.  Patient reports that she tolerated IV iron infusion therapy well, and within a few weeks, she felt that her energy level improved significantly.  She no longer has cravings for ice.  She underwent EGD and colonoscopy in 08/2019, which did not show any source of bleeding.  She denies any symptoms of bleeding, such as hematemesis, hemoptysis, hematuria, hematochezia, or melena.  She takes a daily multivitamin (prenatal vitamin) due to the higher content of folic acid and iron.  She denies any other complaint today.  REVIEW OF SYSTEMS:   Constitutional: ( - ) fevers, ( - )  chills , ( - ) night sweats Eyes: ( - ) blurriness of vision, ( - ) double vision, ( - ) watery eyes Ears, nose, mouth, throat, and face: ( - ) mucositis, ( - ) sore throat Respiratory: ( - ) cough, ( - ) dyspnea, ( - ) wheezes Cardiovascular: ( - ) palpitation, ( - ) chest discomfort, ( - ) lower extremity swelling Gastrointestinal:  ( - ) nausea, ( - ) heartburn, ( - ) change in  bowel habits Skin: ( - ) abnormal skin rashes Lymphatics: ( - ) new lymphadenopathy, ( - ) easy bruising Neurological: ( - ) numbness, ( - ) tingling, ( - ) new weaknesses Behavioral/Psych: ( - ) mood change, ( - ) new changes  All other systems were reviewed with the patient and are negative.  SUMMARY OF ONCOLOGIC HISTORY: Oncology History   No history exists.    I have reviewed the past medical history, past surgical history, social history and family history with the patient and they are unchanged from previous note.  ALLERGIES:  has No Known  Allergies.  MEDICATIONS:  Current Outpatient Medications  Medication Sig Dispense Refill  . ALPRAZolam (XANAX) 0.25 MG tablet Take 1 tablet (0.25 mg total) by mouth 2 (two) times daily as needed for anxiety or sleep. 30 tablet 2  . Ascorbic Acid (VITAMIN C PO) Take by mouth daily.    Marland Kitchen atorvastatin (LIPITOR) 40 MG tablet TAKE 1 AND 1/2 TABLETS BY MOUTH DAILY 135 tablet 1  . hydrochlorothiazide (HYDRODIURIL) 25 MG tablet Take 1 tablet (25 mg total) by mouth daily. 90 tablet 1  . omeprazole (PRILOSEC) 40 MG capsule Take 1 capsule (40 mg total) by mouth daily. 90 capsule 1  . Prenatal Vit-Fe Fumarate-FA (PRENATAL PO) Take by mouth daily.    . Vitamin D, Ergocalciferol, (DRISDOL) 50000 units CAPS capsule Take 1 capsule (50,000 Units total) by mouth every 7 (seven) days. 4 capsule 4   No current facility-administered medications for this visit.    PHYSICAL EXAMINATION: ECOG PERFORMANCE STATUS: 1 - Symptomatic but completely ambulatory  Today's Vitals   11/03/19 1048  BP: (!) 154/62  Pulse: 94  Resp: 18  Temp: 98.2 F (36.8 C)  TempSrc: Temporal  SpO2: 98%  Weight: 161 lb 6.4 oz (73.2 kg)  Height: 5\' 4"  (1.626 m)  PainSc: 0-No pain   Body mass index is 27.7 kg/m.  Filed Weights   11/03/19 1048  Weight: 161 lb 6.4 oz (73.2 kg)    GENERAL: alert, no distress and comfortable SKIN: skin color, texture, turgor are normal, no rashes or significant lesions EYES: conjunctiva are pink and non-injected, sclera clear OROPHARYNX: no exudate, no erythema; lips, buccal mucosa, and tongue normal  NECK: supple, non-tender LUNGS: clear to auscultation with normal breathing effort HEART: regular rate & rhythm and no murmurs and no lower extremity edema ABDOMEN: soft, non-tender, non-distended, normal bowel sounds Musculoskeletal: no cyanosis of digits and no clubbing  PSYCH: alert & oriented x 3, fluent speech  LABORATORY DATA:  I have reviewed the data as listed    Component Value  Date/Time   NA 140 09/01/2019 1422   K 3.5 09/01/2019 1422   CL 101 09/01/2019 1422   CO2 28 09/01/2019 1422   GLUCOSE 123 (H) 09/01/2019 1422   BUN 16 09/01/2019 1422   CREATININE 0.73 09/01/2019 1422   CREATININE 0.64 09/27/2015 1704   CALCIUM 9.8 09/01/2019 1422   PROT 7.2 09/01/2019 1422   ALBUMIN 4.7 09/01/2019 1422   AST 15 09/01/2019 1422   ALT 13 09/01/2019 1422   ALKPHOS 103 09/01/2019 1422   BILITOT 0.5 09/01/2019 1422   GFRNONAA >60 09/01/2019 1422   GFRAA >60 09/01/2019 1422    No results found for: SPEP, UPEP  Lab Results  Component Value Date   WBC 4.8 11/03/2019   NEUTROABS 2.9 11/03/2019   HGB 13.3 11/03/2019   HCT 41.4 11/03/2019   MCV 82.6 11/03/2019   PLT 272 11/03/2019  Chemistry      Component Value Date/Time   NA 140 09/01/2019 1422   K 3.5 09/01/2019 1422   CL 101 09/01/2019 1422   CO2 28 09/01/2019 1422   BUN 16 09/01/2019 1422   CREATININE 0.73 09/01/2019 1422   CREATININE 0.64 09/27/2015 1704      Component Value Date/Time   CALCIUM 9.8 09/01/2019 1422   ALKPHOS 103 09/01/2019 1422   AST 15 09/01/2019 1422   ALT 13 09/01/2019 1422   BILITOT 0.5 09/01/2019 1422       RADIOGRAPHIC STUDIES: I have personally reviewed the radiological images as listed below and agreed with the findings in the report. No results found.

## 2019-11-04 LAB — IRON AND TIBC
Iron: 82 ug/dL (ref 41–142)
Saturation Ratios: 29 % (ref 21–57)
TIBC: 280 ug/dL (ref 236–444)
UIBC: 198 ug/dL (ref 120–384)

## 2019-11-04 LAB — FERRITIN: Ferritin: 169 ng/mL (ref 11–307)

## 2019-11-10 ENCOUNTER — Other Ambulatory Visit: Payer: Self-pay | Admitting: Family Medicine

## 2019-11-11 ENCOUNTER — Other Ambulatory Visit: Payer: Self-pay | Admitting: Family Medicine

## 2019-11-11 DIAGNOSIS — E785 Hyperlipidemia, unspecified: Secondary | ICD-10-CM

## 2019-11-11 DIAGNOSIS — K219 Gastro-esophageal reflux disease without esophagitis: Secondary | ICD-10-CM

## 2019-11-11 DIAGNOSIS — I10 Essential (primary) hypertension: Secondary | ICD-10-CM

## 2019-11-11 DIAGNOSIS — F419 Anxiety disorder, unspecified: Secondary | ICD-10-CM

## 2019-11-11 DIAGNOSIS — F329 Major depressive disorder, single episode, unspecified: Secondary | ICD-10-CM

## 2019-11-11 DIAGNOSIS — M810 Age-related osteoporosis without current pathological fracture: Secondary | ICD-10-CM

## 2019-11-11 NOTE — Telephone Encounter (Signed)
Last OV 05/17/19 Last refill 05/17/19 #90/1 Next OV  not scheduled

## 2019-12-14 DIAGNOSIS — C4442 Squamous cell carcinoma of skin of scalp and neck: Secondary | ICD-10-CM | POA: Diagnosis not present

## 2019-12-14 DIAGNOSIS — L570 Actinic keratosis: Secondary | ICD-10-CM | POA: Diagnosis not present

## 2019-12-14 DIAGNOSIS — L578 Other skin changes due to chronic exposure to nonionizing radiation: Secondary | ICD-10-CM | POA: Diagnosis not present

## 2019-12-19 ENCOUNTER — Telehealth: Payer: Self-pay | Admitting: *Deleted

## 2019-12-19 NOTE — Telephone Encounter (Signed)
Will need to recollect 

## 2019-12-19 NOTE — Telephone Encounter (Signed)
Please advise 

## 2019-12-19 NOTE — Telephone Encounter (Signed)
Received call from [facility] stating due to multiple mail delays, they are just receiving pt's IFOB from September and specimen is now too old to test. Please advise if pt needs to recollect specimen? If so, pt should physically return specimen to our office instead of mailing it in to the lab.

## 2019-12-21 ENCOUNTER — Telehealth: Payer: Self-pay | Admitting: *Deleted

## 2019-12-21 NOTE — Telephone Encounter (Signed)
Patient would like to know when she should be seen again.  She would like an in person visit.    [name] can you look in to this.

## 2019-12-21 NOTE — Telephone Encounter (Signed)
Patient stated that she feels she does not need this done because she has an colonoscopy in October and has been seeing Dr. Fuller Plan.

## 2019-12-22 NOTE — Telephone Encounter (Signed)
Please schedule patient for in person visit.

## 2019-12-27 DIAGNOSIS — C4442 Squamous cell carcinoma of skin of scalp and neck: Secondary | ICD-10-CM | POA: Diagnosis not present

## 2020-01-06 DIAGNOSIS — H353131 Nonexudative age-related macular degeneration, bilateral, early dry stage: Secondary | ICD-10-CM | POA: Diagnosis not present

## 2020-01-19 DIAGNOSIS — M79672 Pain in left foot: Secondary | ICD-10-CM | POA: Diagnosis not present

## 2020-01-19 DIAGNOSIS — M79671 Pain in right foot: Secondary | ICD-10-CM | POA: Diagnosis not present

## 2020-01-29 IMAGING — DX DG CHEST 2V
2 series · 2 of 2 positions shown · non-contrast
Comparison: 03/02/2014.

CLINICAL DATA: Persistent cough for the past 6 months.

EXAM:
CHEST - 2 VIEW

[chest pa]
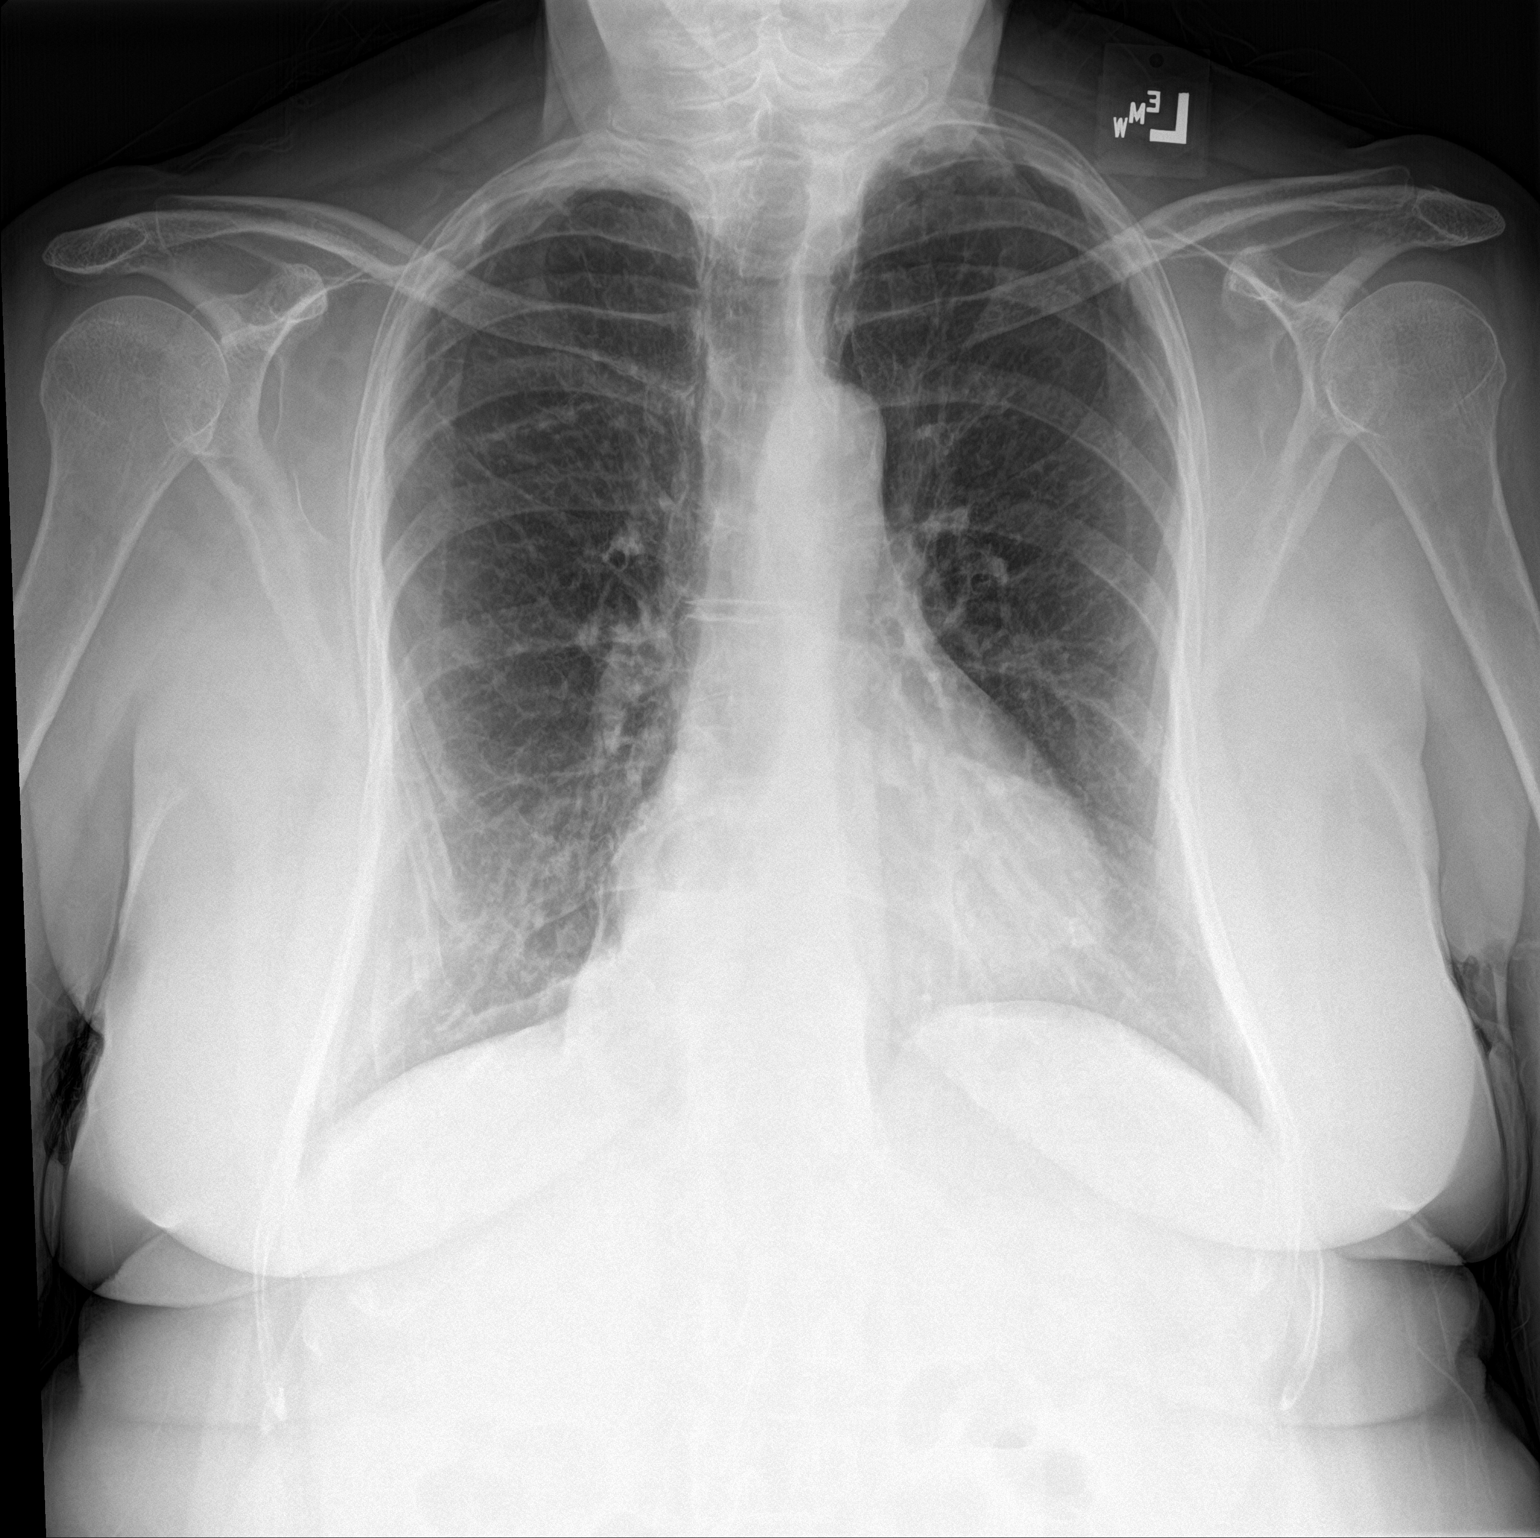

[chest lat]
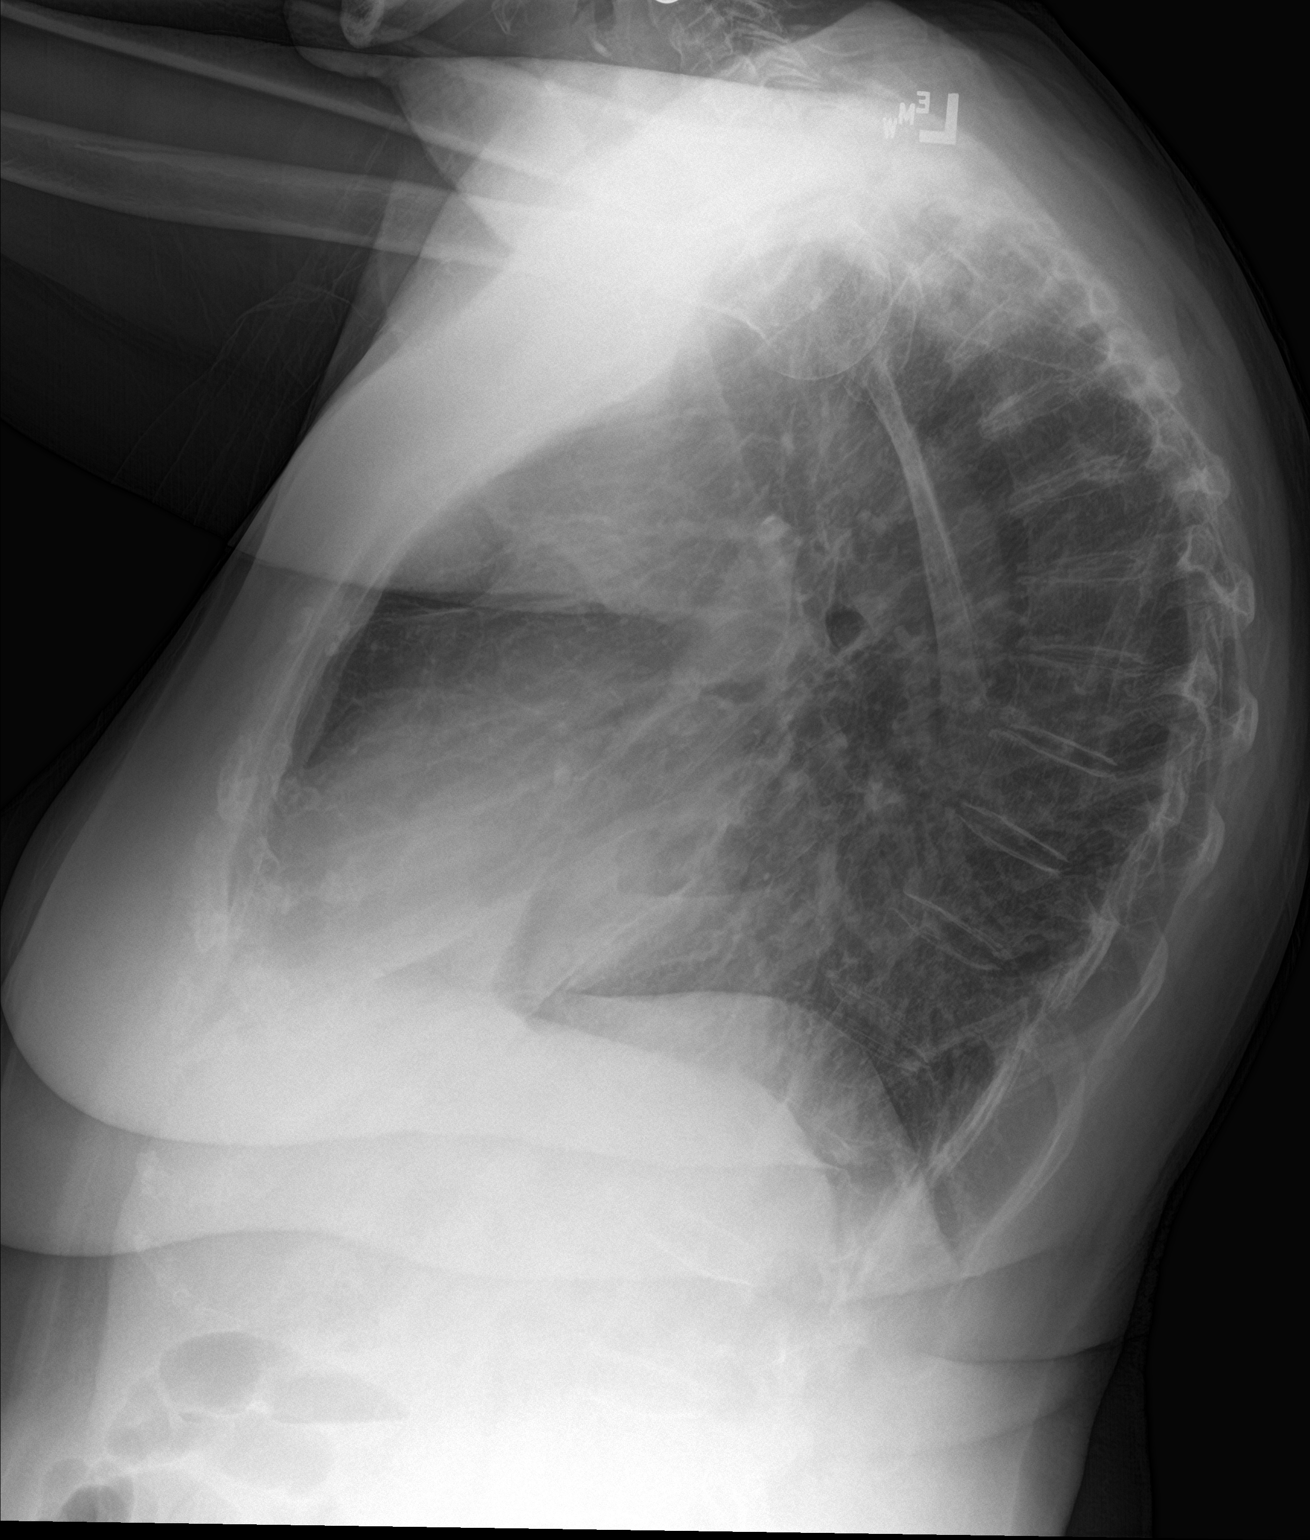

[2 of 2 positions shown; findings below may reference images not displayed]

FINDINGS: Normal sized heart. Clear lungs. Diffuse peribronchial thickening.
Biapical pleural and parenchymal scarring. Moderate-sized hiatal
hernia. Mild thoracic spine degenerative changes.
IMPRESSION: 1. Moderate bronchitic changes with progression.
2. Stable hiatal hernia.

## 2020-02-07 ENCOUNTER — Ambulatory Visit: Payer: Medicare Other | Admitting: Family Medicine

## 2020-02-17 DIAGNOSIS — Z1331 Encounter for screening for depression: Secondary | ICD-10-CM | POA: Diagnosis not present

## 2020-02-17 DIAGNOSIS — D509 Iron deficiency anemia, unspecified: Secondary | ICD-10-CM | POA: Diagnosis not present

## 2020-02-17 DIAGNOSIS — M810 Age-related osteoporosis without current pathological fracture: Secondary | ICD-10-CM | POA: Diagnosis not present

## 2020-02-17 DIAGNOSIS — E7849 Other hyperlipidemia: Secondary | ICD-10-CM | POA: Diagnosis not present

## 2020-02-17 DIAGNOSIS — I10 Essential (primary) hypertension: Secondary | ICD-10-CM | POA: Diagnosis not present

## 2020-02-17 DIAGNOSIS — K219 Gastro-esophageal reflux disease without esophagitis: Secondary | ICD-10-CM | POA: Diagnosis not present

## 2020-02-17 DIAGNOSIS — F419 Anxiety disorder, unspecified: Secondary | ICD-10-CM | POA: Diagnosis not present

## 2020-02-17 DIAGNOSIS — E663 Overweight: Secondary | ICD-10-CM | POA: Diagnosis not present

## 2020-03-06 DIAGNOSIS — M21611 Bunion of right foot: Secondary | ICD-10-CM | POA: Diagnosis not present

## 2020-03-06 DIAGNOSIS — M792 Neuralgia and neuritis, unspecified: Secondary | ICD-10-CM | POA: Diagnosis not present

## 2020-03-06 DIAGNOSIS — M722 Plantar fascial fibromatosis: Secondary | ICD-10-CM | POA: Diagnosis not present

## 2020-03-06 DIAGNOSIS — M79672 Pain in left foot: Secondary | ICD-10-CM | POA: Diagnosis not present

## 2020-03-06 DIAGNOSIS — M21612 Bunion of left foot: Secondary | ICD-10-CM | POA: Diagnosis not present

## 2020-03-15 DIAGNOSIS — M21612 Bunion of left foot: Secondary | ICD-10-CM | POA: Diagnosis not present

## 2020-03-15 DIAGNOSIS — M21611 Bunion of right foot: Secondary | ICD-10-CM | POA: Diagnosis not present

## 2020-03-15 DIAGNOSIS — Q6672 Congenital pes cavus, left foot: Secondary | ICD-10-CM | POA: Diagnosis not present

## 2020-03-15 DIAGNOSIS — M792 Neuralgia and neuritis, unspecified: Secondary | ICD-10-CM | POA: Diagnosis not present

## 2020-03-15 DIAGNOSIS — M79671 Pain in right foot: Secondary | ICD-10-CM | POA: Diagnosis not present

## 2020-03-15 DIAGNOSIS — M205X2 Other deformities of toe(s) (acquired), left foot: Secondary | ICD-10-CM | POA: Diagnosis not present

## 2020-04-04 ENCOUNTER — Telehealth: Payer: Self-pay | Admitting: Hematology

## 2020-04-04 NOTE — Telephone Encounter (Signed)
Tried calling patient and was unable to reach her by phone at this time.  I will try again tomorrow 5/13 regarding transferring care to [facility] due to Dr Maylon Peppers leaving the practice

## 2020-04-10 DIAGNOSIS — C4442 Squamous cell carcinoma of skin of scalp and neck: Secondary | ICD-10-CM | POA: Diagnosis not present

## 2020-04-10 DIAGNOSIS — L814 Other melanin hyperpigmentation: Secondary | ICD-10-CM | POA: Diagnosis not present

## 2020-04-10 DIAGNOSIS — L578 Other skin changes due to chronic exposure to nonionizing radiation: Secondary | ICD-10-CM | POA: Diagnosis not present

## 2020-04-10 DIAGNOSIS — L821 Other seborrheic keratosis: Secondary | ICD-10-CM | POA: Diagnosis not present

## 2020-04-10 DIAGNOSIS — L570 Actinic keratosis: Secondary | ICD-10-CM | POA: Diagnosis not present

## 2020-04-17 DIAGNOSIS — Q6672 Congenital pes cavus, left foot: Secondary | ICD-10-CM | POA: Diagnosis not present

## 2020-04-17 DIAGNOSIS — M79671 Pain in right foot: Secondary | ICD-10-CM | POA: Diagnosis not present

## 2020-04-17 DIAGNOSIS — M792 Neuralgia and neuritis, unspecified: Secondary | ICD-10-CM | POA: Diagnosis not present

## 2020-04-17 DIAGNOSIS — M205X2 Other deformities of toe(s) (acquired), left foot: Secondary | ICD-10-CM | POA: Diagnosis not present

## 2020-05-03 ENCOUNTER — Other Ambulatory Visit: Payer: Medicare Other

## 2020-05-03 ENCOUNTER — Ambulatory Visit: Payer: Medicare Other | Admitting: Hematology

## 2020-05-10 DIAGNOSIS — E559 Vitamin D deficiency, unspecified: Secondary | ICD-10-CM | POA: Diagnosis not present

## 2020-05-10 DIAGNOSIS — E7849 Other hyperlipidemia: Secondary | ICD-10-CM | POA: Diagnosis not present

## 2020-05-23 DIAGNOSIS — I739 Peripheral vascular disease, unspecified: Secondary | ICD-10-CM | POA: Diagnosis not present

## 2020-05-23 DIAGNOSIS — I70223 Atherosclerosis of native arteries of extremities with rest pain, bilateral legs: Secondary | ICD-10-CM | POA: Diagnosis not present

## 2020-05-25 ENCOUNTER — Other Ambulatory Visit: Payer: Self-pay | Admitting: Internal Medicine

## 2020-05-25 DIAGNOSIS — M810 Age-related osteoporosis without current pathological fracture: Secondary | ICD-10-CM

## 2020-05-25 DIAGNOSIS — Z1231 Encounter for screening mammogram for malignant neoplasm of breast: Secondary | ICD-10-CM

## 2020-06-19 DIAGNOSIS — M792 Neuralgia and neuritis, unspecified: Secondary | ICD-10-CM | POA: Diagnosis not present

## 2020-06-19 DIAGNOSIS — M722 Plantar fascial fibromatosis: Secondary | ICD-10-CM | POA: Diagnosis not present

## 2020-06-19 DIAGNOSIS — M205X2 Other deformities of toe(s) (acquired), left foot: Secondary | ICD-10-CM | POA: Diagnosis not present

## 2020-06-19 DIAGNOSIS — M79671 Pain in right foot: Secondary | ICD-10-CM | POA: Diagnosis not present

## 2020-06-22 DIAGNOSIS — I70223 Atherosclerosis of native arteries of extremities with rest pain, bilateral legs: Secondary | ICD-10-CM | POA: Diagnosis not present

## 2020-06-30 DIAGNOSIS — I70221 Atherosclerosis of native arteries of extremities with rest pain, right leg: Secondary | ICD-10-CM | POA: Diagnosis not present

## 2020-07-11 DIAGNOSIS — I10 Essential (primary) hypertension: Secondary | ICD-10-CM | POA: Diagnosis not present

## 2020-07-11 DIAGNOSIS — R5383 Other fatigue: Secondary | ICD-10-CM | POA: Diagnosis not present

## 2020-07-11 DIAGNOSIS — Z79899 Other long term (current) drug therapy: Secondary | ICD-10-CM | POA: Diagnosis not present

## 2020-07-11 DIAGNOSIS — F419 Anxiety disorder, unspecified: Secondary | ICD-10-CM | POA: Diagnosis not present

## 2020-07-11 DIAGNOSIS — M542 Cervicalgia: Secondary | ICD-10-CM | POA: Diagnosis not present

## 2020-07-11 DIAGNOSIS — M545 Low back pain: Secondary | ICD-10-CM | POA: Diagnosis not present

## 2020-07-15 DIAGNOSIS — I70222 Atherosclerosis of native arteries of extremities with rest pain, left leg: Secondary | ICD-10-CM | POA: Diagnosis not present

## 2020-07-17 DIAGNOSIS — M79671 Pain in right foot: Secondary | ICD-10-CM | POA: Diagnosis not present

## 2020-07-17 DIAGNOSIS — M792 Neuralgia and neuritis, unspecified: Secondary | ICD-10-CM | POA: Diagnosis not present

## 2020-07-17 DIAGNOSIS — M205X2 Other deformities of toe(s) (acquired), left foot: Secondary | ICD-10-CM | POA: Diagnosis not present

## 2020-07-17 DIAGNOSIS — Q6672 Congenital pes cavus, left foot: Secondary | ICD-10-CM | POA: Diagnosis not present

## 2020-07-24 DIAGNOSIS — M79605 Pain in left leg: Secondary | ICD-10-CM | POA: Diagnosis not present

## 2020-07-26 DIAGNOSIS — Z23 Encounter for immunization: Secondary | ICD-10-CM | POA: Diagnosis not present

## 2020-08-23 ENCOUNTER — Ambulatory Visit
Admission: RE | Admit: 2020-08-23 | Discharge: 2020-08-23 | Disposition: A | Discharge status: Home / Self Care | Payer: Medicare Other | Source: Ambulatory Visit | Attending: Internal Medicine | Admitting: Internal Medicine

## 2020-08-23 ENCOUNTER — Other Ambulatory Visit: Payer: Self-pay

## 2020-08-23 DIAGNOSIS — M810 Age-related osteoporosis without current pathological fracture: Secondary | ICD-10-CM

## 2020-08-23 DIAGNOSIS — Z780 Asymptomatic menopausal state: Secondary | ICD-10-CM | POA: Diagnosis not present

## 2020-08-23 DIAGNOSIS — Z1231 Encounter for screening mammogram for malignant neoplasm of breast: Secondary | ICD-10-CM | POA: Diagnosis not present

## 2020-10-24 DIAGNOSIS — F419 Anxiety disorder, unspecified: Secondary | ICD-10-CM | POA: Diagnosis not present

## 2020-10-24 DIAGNOSIS — E785 Hyperlipidemia, unspecified: Secondary | ICD-10-CM | POA: Diagnosis not present

## 2020-10-24 DIAGNOSIS — K219 Gastro-esophageal reflux disease without esophagitis: Secondary | ICD-10-CM | POA: Diagnosis not present

## 2020-10-24 DIAGNOSIS — I739 Peripheral vascular disease, unspecified: Secondary | ICD-10-CM | POA: Diagnosis not present

## 2020-10-24 DIAGNOSIS — I10 Essential (primary) hypertension: Secondary | ICD-10-CM | POA: Diagnosis not present

## 2020-10-24 DIAGNOSIS — Z23 Encounter for immunization: Secondary | ICD-10-CM | POA: Diagnosis not present

## 2020-10-24 DIAGNOSIS — M810 Age-related osteoporosis without current pathological fracture: Secondary | ICD-10-CM | POA: Diagnosis not present

## 2020-11-13 ENCOUNTER — Encounter (HOSPITAL_COMMUNITY): Payer: Medicare Other

## 2020-11-30 ENCOUNTER — Other Ambulatory Visit (HOSPITAL_COMMUNITY): Payer: Self-pay | Admitting: *Deleted

## 2020-12-03 ENCOUNTER — Encounter (HOSPITAL_COMMUNITY)
Admission: RE | Admit: 2020-12-03 | Discharge: 2020-12-03 | Disposition: A | Discharge status: Home / Self Care | Payer: Medicare Other | Source: Ambulatory Visit | Attending: Internal Medicine | Admitting: Internal Medicine

## 2020-12-03 ENCOUNTER — Other Ambulatory Visit: Payer: Self-pay

## 2020-12-03 DIAGNOSIS — M810 Age-related osteoporosis without current pathological fracture: Secondary | ICD-10-CM | POA: Insufficient documentation

## 2020-12-03 MED ORDER — DENOSUMAB 60 MG/ML ~~LOC~~ SOSY
60.0000 mg | PREFILLED_SYRINGE | Freq: Once | SUBCUTANEOUS | Status: AC
  Administered 2020-12-03: 60 mg via SUBCUTANEOUS

## 2020-12-03 MED ORDER — DENOSUMAB 60 MG/ML ~~LOC~~ SOSY
PREFILLED_SYRINGE | SUBCUTANEOUS | Status: AC
  Filled 2020-12-03: qty 1

## 2020-12-03 NOTE — Discharge Instructions (Signed)
Denosumab injection What is this medicine? DENOSUMAB (den oh sue mab) slows bone breakdown. Prolia is used to treat osteoporosis in women after menopause and in men, and in people who are taking corticosteroids for 6 months or more. Xgeva is used to treat a high calcium level due to cancer and to prevent bone fractures and other bone problems caused by multiple myeloma or cancer bone metastases. Xgeva is also used to treat giant cell tumor of the bone. This medicine may be used for other purposes; ask your health care provider or pharmacist if you have questions. COMMON BRAND NAME(S): Prolia, XGEVA What should I tell my health care provider before I take this medicine? They need to know if you have any of these conditions:  dental disease  having surgery or tooth extraction  infection  kidney disease  low levels of calcium or Vitamin D in the blood  malnutrition  on hemodialysis  skin conditions or sensitivity  thyroid or parathyroid disease  an unusual reaction to denosumab, other medicines, foods, dyes, or preservatives  pregnant or trying to get pregnant  breast-feeding How should I use this medicine? This medicine is for injection under the skin. It is given by a health care professional in a hospital or clinic setting. A special MedGuide will be given to you before each treatment. Be sure to read this information carefully each time. For Prolia, talk to your pediatrician regarding the use of this medicine in children. Special care may be needed. For Xgeva, talk to your pediatrician regarding the use of this medicine in children. While this drug may be prescribed for children as young as 13 years for selected conditions, precautions do apply. Overdosage: If you think you have taken too much of this medicine contact a poison control center or emergency room at once. NOTE: This medicine is only for you. Do not share this medicine with others. What if I miss a dose? It is  important not to miss your dose. Call your doctor or health care professional if you are unable to keep an appointment. What may interact with this medicine? Do not take this medicine with any of the following medications:  other medicines containing denosumab This medicine may also interact with the following medications:  medicines that lower your chance of fighting infection  steroid medicines like prednisone or cortisone This list may not describe all possible interactions. Give your health care provider a list of all the medicines, herbs, non-prescription drugs, or dietary supplements you use. Also tell them if you smoke, drink alcohol, or use illegal drugs. Some items may interact with your medicine. What should I watch for while using this medicine? Visit your doctor or health care professional for regular checks on your progress. Your doctor or health care professional may order blood tests and other tests to see how you are doing. Call your doctor or health care professional for advice if you get a fever, chills or sore throat, or other symptoms of a cold or flu. Do not treat yourself. This drug may decrease your body's ability to fight infection. Try to avoid being around people who are sick. You should make sure you get enough calcium and vitamin D while you are taking this medicine, unless your doctor tells you not to. Discuss the foods you eat and the vitamins you take with your health care professional. See your dentist regularly. Brush and floss your teeth as directed. Before you have any dental work done, tell your dentist you are   receiving this medicine. Do not become pregnant while taking this medicine or for 5 months after stopping it. Talk with your doctor or health care professional about your birth control options while taking this medicine. Women should inform their doctor if they wish to become pregnant or think they might be pregnant. There is a potential for serious side  effects to an unborn child. Talk to your health care professional or pharmacist for more information. What side effects may I notice from receiving this medicine? Side effects that you should report to your doctor or health care professional as soon as possible:  allergic reactions like skin rash, itching or hives, swelling of the face, lips, or tongue  bone pain  breathing problems  dizziness  jaw pain, especially after dental work  redness, blistering, peeling of the skin  signs and symptoms of infection like fever or chills; cough; sore throat; pain or trouble passing urine  signs of low calcium like fast heartbeat, muscle cramps or muscle pain; pain, tingling, numbness in the hands or feet; seizures  unusual bleeding or bruising  unusually weak or tired Side effects that usually do not require medical attention (report to your doctor or health care professional if they continue or are bothersome):  constipation  diarrhea  headache  joint pain  loss of appetite  muscle pain  runny nose  tiredness  upset stomach This list may not describe all possible side effects. Call your doctor for medical advice about side effects. You may report side effects to FDA at 1-800-FDA-1088. Where should I keep my medicine? This medicine is only given in a clinic, doctor's office, or other health care setting and will not be stored at home. NOTE: This sheet is a summary. It may not cover all possible information. If you have questions about this medicine, talk to your doctor, pharmacist, or health care provider.  2021 [employer]/Gold Standard (2018-03-19 16:10:44)

## 2021-02-06 DIAGNOSIS — I10 Essential (primary) hypertension: Secondary | ICD-10-CM | POA: Diagnosis not present

## 2021-02-06 DIAGNOSIS — M542 Cervicalgia: Secondary | ICD-10-CM | POA: Diagnosis not present

## 2021-02-15 DIAGNOSIS — D485 Neoplasm of uncertain behavior of skin: Secondary | ICD-10-CM | POA: Diagnosis not present

## 2021-02-15 DIAGNOSIS — L853 Xerosis cutis: Secondary | ICD-10-CM | POA: Diagnosis not present

## 2021-02-15 DIAGNOSIS — L989 Disorder of the skin and subcutaneous tissue, unspecified: Secondary | ICD-10-CM | POA: Diagnosis not present

## 2021-02-15 DIAGNOSIS — L299 Pruritus, unspecified: Secondary | ICD-10-CM | POA: Diagnosis not present

## 2021-04-25 DIAGNOSIS — Z0000 Encounter for general adult medical examination without abnormal findings: Secondary | ICD-10-CM | POA: Diagnosis not present

## 2021-04-25 DIAGNOSIS — E785 Hyperlipidemia, unspecified: Secondary | ICD-10-CM | POA: Diagnosis not present

## 2021-04-25 DIAGNOSIS — E559 Vitamin D deficiency, unspecified: Secondary | ICD-10-CM | POA: Diagnosis not present

## 2021-05-02 DIAGNOSIS — E663 Overweight: Secondary | ICD-10-CM | POA: Diagnosis not present

## 2021-05-02 DIAGNOSIS — I739 Peripheral vascular disease, unspecified: Secondary | ICD-10-CM | POA: Diagnosis not present

## 2021-05-02 DIAGNOSIS — Z1339 Encounter for screening examination for other mental health and behavioral disorders: Secondary | ICD-10-CM | POA: Diagnosis not present

## 2021-05-02 DIAGNOSIS — I10 Essential (primary) hypertension: Secondary | ICD-10-CM | POA: Diagnosis not present

## 2021-05-02 DIAGNOSIS — Z1331 Encounter for screening for depression: Secondary | ICD-10-CM | POA: Diagnosis not present

## 2021-05-02 DIAGNOSIS — F419 Anxiety disorder, unspecified: Secondary | ICD-10-CM | POA: Diagnosis not present

## 2021-05-02 DIAGNOSIS — Z0000 Encounter for general adult medical examination without abnormal findings: Secondary | ICD-10-CM | POA: Diagnosis not present

## 2021-05-02 DIAGNOSIS — E785 Hyperlipidemia, unspecified: Secondary | ICD-10-CM | POA: Diagnosis not present

## 2021-05-02 DIAGNOSIS — M810 Age-related osteoporosis without current pathological fracture: Secondary | ICD-10-CM | POA: Diagnosis not present

## 2021-06-27 DIAGNOSIS — Z20822 Contact with and (suspected) exposure to covid-19: Secondary | ICD-10-CM | POA: Diagnosis not present

## 2021-07-29 DIAGNOSIS — Z20822 Contact with and (suspected) exposure to covid-19: Secondary | ICD-10-CM | POA: Diagnosis not present

## 2021-07-31 DIAGNOSIS — L814 Other melanin hyperpigmentation: Secondary | ICD-10-CM | POA: Diagnosis not present

## 2021-07-31 DIAGNOSIS — D225 Melanocytic nevi of trunk: Secondary | ICD-10-CM | POA: Diagnosis not present

## 2021-07-31 DIAGNOSIS — L570 Actinic keratosis: Secondary | ICD-10-CM | POA: Diagnosis not present

## 2021-07-31 DIAGNOSIS — L578 Other skin changes due to chronic exposure to nonionizing radiation: Secondary | ICD-10-CM | POA: Diagnosis not present

## 2021-07-31 DIAGNOSIS — L821 Other seborrheic keratosis: Secondary | ICD-10-CM | POA: Diagnosis not present

## 2021-08-22 DIAGNOSIS — Z23 Encounter for immunization: Secondary | ICD-10-CM | POA: Diagnosis not present

## 2021-09-06 DIAGNOSIS — I70223 Atherosclerosis of native arteries of extremities with rest pain, bilateral legs: Secondary | ICD-10-CM | POA: Diagnosis not present

## 2021-09-26 DIAGNOSIS — Z23 Encounter for immunization: Secondary | ICD-10-CM | POA: Diagnosis not present

## 2021-10-02 DIAGNOSIS — I739 Peripheral vascular disease, unspecified: Secondary | ICD-10-CM | POA: Diagnosis not present

## 2021-11-28 DIAGNOSIS — E785 Hyperlipidemia, unspecified: Secondary | ICD-10-CM | POA: Diagnosis not present

## 2021-11-28 DIAGNOSIS — M791 Myalgia, unspecified site: Secondary | ICD-10-CM | POA: Diagnosis not present

## 2021-11-28 DIAGNOSIS — F419 Anxiety disorder, unspecified: Secondary | ICD-10-CM | POA: Diagnosis not present

## 2021-11-28 DIAGNOSIS — I739 Peripheral vascular disease, unspecified: Secondary | ICD-10-CM | POA: Diagnosis not present

## 2021-11-28 DIAGNOSIS — K219 Gastro-esophageal reflux disease without esophagitis: Secondary | ICD-10-CM | POA: Diagnosis not present

## 2021-11-28 DIAGNOSIS — I10 Essential (primary) hypertension: Secondary | ICD-10-CM | POA: Diagnosis not present

## 2021-11-28 DIAGNOSIS — M810 Age-related osteoporosis without current pathological fracture: Secondary | ICD-10-CM | POA: Diagnosis not present

## 2021-12-31 DIAGNOSIS — J209 Acute bronchitis, unspecified: Secondary | ICD-10-CM | POA: Diagnosis not present

## 2022-01-17 DIAGNOSIS — Z20822 Contact with and (suspected) exposure to covid-19: Secondary | ICD-10-CM | POA: Diagnosis not present

## 2022-02-14 DIAGNOSIS — Z20822 Contact with and (suspected) exposure to covid-19: Secondary | ICD-10-CM | POA: Diagnosis not present

## 2022-02-23 DIAGNOSIS — Z20822 Contact with and (suspected) exposure to covid-19: Secondary | ICD-10-CM | POA: Diagnosis not present

## 2022-03-10 DIAGNOSIS — Z20822 Contact with and (suspected) exposure to covid-19: Secondary | ICD-10-CM | POA: Diagnosis not present

## 2022-03-24 DIAGNOSIS — Z1231 Encounter for screening mammogram for malignant neoplasm of breast: Secondary | ICD-10-CM | POA: Diagnosis not present

## 2022-03-31 DIAGNOSIS — Z20822 Contact with and (suspected) exposure to covid-19: Secondary | ICD-10-CM | POA: Diagnosis not present

## 2022-05-08 DIAGNOSIS — I10 Essential (primary) hypertension: Secondary | ICD-10-CM | POA: Diagnosis not present

## 2022-05-08 DIAGNOSIS — R7989 Other specified abnormal findings of blood chemistry: Secondary | ICD-10-CM | POA: Diagnosis not present

## 2022-05-08 DIAGNOSIS — F419 Anxiety disorder, unspecified: Secondary | ICD-10-CM | POA: Diagnosis not present

## 2022-05-08 DIAGNOSIS — R5383 Other fatigue: Secondary | ICD-10-CM | POA: Diagnosis not present

## 2022-05-08 DIAGNOSIS — E559 Vitamin D deficiency, unspecified: Secondary | ICD-10-CM | POA: Diagnosis not present

## 2022-05-08 DIAGNOSIS — E785 Hyperlipidemia, unspecified: Secondary | ICD-10-CM | POA: Diagnosis not present

## 2022-05-15 DIAGNOSIS — I10 Essential (primary) hypertension: Secondary | ICD-10-CM | POA: Diagnosis not present

## 2022-05-15 DIAGNOSIS — E663 Overweight: Secondary | ICD-10-CM | POA: Diagnosis not present

## 2022-05-15 DIAGNOSIS — D509 Iron deficiency anemia, unspecified: Secondary | ICD-10-CM | POA: Diagnosis not present

## 2022-05-15 DIAGNOSIS — E785 Hyperlipidemia, unspecified: Secondary | ICD-10-CM | POA: Diagnosis not present

## 2022-05-15 DIAGNOSIS — K219 Gastro-esophageal reflux disease without esophagitis: Secondary | ICD-10-CM | POA: Diagnosis not present

## 2022-05-15 DIAGNOSIS — F419 Anxiety disorder, unspecified: Secondary | ICD-10-CM | POA: Diagnosis not present

## 2022-05-15 DIAGNOSIS — Z0000 Encounter for general adult medical examination without abnormal findings: Secondary | ICD-10-CM | POA: Diagnosis not present

## 2022-05-15 DIAGNOSIS — Z6827 Body mass index (BMI) 27.0-27.9, adult: Secondary | ICD-10-CM | POA: Diagnosis not present

## 2022-05-15 DIAGNOSIS — Z1339 Encounter for screening examination for other mental health and behavioral disorders: Secondary | ICD-10-CM | POA: Diagnosis not present

## 2022-05-15 DIAGNOSIS — I739 Peripheral vascular disease, unspecified: Secondary | ICD-10-CM | POA: Diagnosis not present

## 2022-05-15 DIAGNOSIS — M810 Age-related osteoporosis without current pathological fracture: Secondary | ICD-10-CM | POA: Diagnosis not present

## 2022-05-15 DIAGNOSIS — Z1331 Encounter for screening for depression: Secondary | ICD-10-CM | POA: Diagnosis not present

## 2022-08-14 DIAGNOSIS — H353131 Nonexudative age-related macular degeneration, bilateral, early dry stage: Secondary | ICD-10-CM | POA: Diagnosis not present

## 2022-10-09 DIAGNOSIS — Z23 Encounter for immunization: Secondary | ICD-10-CM | POA: Diagnosis not present

## 2022-11-04 DIAGNOSIS — Z23 Encounter for immunization: Secondary | ICD-10-CM | POA: Diagnosis not present

## 2022-11-27 DIAGNOSIS — E785 Hyperlipidemia, unspecified: Secondary | ICD-10-CM | POA: Diagnosis not present

## 2022-11-27 DIAGNOSIS — D509 Iron deficiency anemia, unspecified: Secondary | ICD-10-CM | POA: Diagnosis not present

## 2022-11-27 DIAGNOSIS — M810 Age-related osteoporosis without current pathological fracture: Secondary | ICD-10-CM | POA: Diagnosis not present

## 2022-11-27 DIAGNOSIS — I739 Peripheral vascular disease, unspecified: Secondary | ICD-10-CM | POA: Diagnosis not present

## 2022-11-27 DIAGNOSIS — I10 Essential (primary) hypertension: Secondary | ICD-10-CM | POA: Diagnosis not present

## 2022-12-25 DIAGNOSIS — M810 Age-related osteoporosis without current pathological fracture: Secondary | ICD-10-CM | POA: Diagnosis not present

## 2023-06-11 DIAGNOSIS — L821 Other seborrheic keratosis: Secondary | ICD-10-CM | POA: Diagnosis not present

## 2023-06-11 DIAGNOSIS — D0462 Carcinoma in situ of skin of left upper limb, including shoulder: Secondary | ICD-10-CM | POA: Diagnosis not present

## 2023-07-07 DIAGNOSIS — I10 Essential (primary) hypertension: Secondary | ICD-10-CM | POA: Diagnosis not present

## 2023-07-07 DIAGNOSIS — E785 Hyperlipidemia, unspecified: Secondary | ICD-10-CM | POA: Diagnosis not present

## 2023-07-07 DIAGNOSIS — E559 Vitamin D deficiency, unspecified: Secondary | ICD-10-CM | POA: Diagnosis not present

## 2023-07-07 DIAGNOSIS — Z79899 Other long term (current) drug therapy: Secondary | ICD-10-CM | POA: Diagnosis not present

## 2023-07-07 DIAGNOSIS — D509 Iron deficiency anemia, unspecified: Secondary | ICD-10-CM | POA: Diagnosis not present

## 2023-07-07 DIAGNOSIS — R7303 Prediabetes: Secondary | ICD-10-CM | POA: Diagnosis not present

## 2023-07-14 DIAGNOSIS — Z6828 Body mass index (BMI) 28.0-28.9, adult: Secondary | ICD-10-CM | POA: Diagnosis not present

## 2023-07-14 DIAGNOSIS — Z1389 Encounter for screening for other disorder: Secondary | ICD-10-CM | POA: Diagnosis not present

## 2023-07-14 DIAGNOSIS — F419 Anxiety disorder, unspecified: Secondary | ICD-10-CM | POA: Diagnosis not present

## 2023-07-14 DIAGNOSIS — G609 Hereditary and idiopathic neuropathy, unspecified: Secondary | ICD-10-CM | POA: Diagnosis not present

## 2023-07-14 DIAGNOSIS — I739 Peripheral vascular disease, unspecified: Secondary | ICD-10-CM | POA: Diagnosis not present

## 2023-07-14 DIAGNOSIS — E785 Hyperlipidemia, unspecified: Secondary | ICD-10-CM | POA: Diagnosis not present

## 2023-07-14 DIAGNOSIS — Z0000 Encounter for general adult medical examination without abnormal findings: Secondary | ICD-10-CM | POA: Diagnosis not present

## 2023-07-14 DIAGNOSIS — D509 Iron deficiency anemia, unspecified: Secondary | ICD-10-CM | POA: Diagnosis not present

## 2023-07-14 DIAGNOSIS — F320 Major depressive disorder, single episode, mild: Secondary | ICD-10-CM | POA: Diagnosis not present

## 2023-07-14 DIAGNOSIS — I10 Essential (primary) hypertension: Secondary | ICD-10-CM | POA: Diagnosis not present

## 2023-07-14 DIAGNOSIS — E663 Overweight: Secondary | ICD-10-CM | POA: Diagnosis not present

## 2023-07-14 DIAGNOSIS — Z1331 Encounter for screening for depression: Secondary | ICD-10-CM | POA: Diagnosis not present

## 2023-07-14 DIAGNOSIS — M810 Age-related osteoporosis without current pathological fracture: Secondary | ICD-10-CM | POA: Diagnosis not present

## 2023-07-14 DIAGNOSIS — Z1339 Encounter for screening examination for other mental health and behavioral disorders: Secondary | ICD-10-CM | POA: Diagnosis not present

## 2023-07-14 DIAGNOSIS — E559 Vitamin D deficiency, unspecified: Secondary | ICD-10-CM | POA: Diagnosis not present

## 2023-07-14 DIAGNOSIS — R7303 Prediabetes: Secondary | ICD-10-CM | POA: Diagnosis not present

## 2023-10-01 DIAGNOSIS — Z23 Encounter for immunization: Secondary | ICD-10-CM | POA: Diagnosis not present

## 2023-10-15 DIAGNOSIS — F339 Major depressive disorder, recurrent, unspecified: Secondary | ICD-10-CM | POA: Diagnosis not present

## 2023-10-15 DIAGNOSIS — Z6826 Body mass index (BMI) 26.0-26.9, adult: Secondary | ICD-10-CM | POA: Diagnosis not present

## 2023-10-15 DIAGNOSIS — D509 Iron deficiency anemia, unspecified: Secondary | ICD-10-CM | POA: Diagnosis not present

## 2023-10-15 DIAGNOSIS — R7303 Prediabetes: Secondary | ICD-10-CM | POA: Diagnosis not present

## 2023-10-15 DIAGNOSIS — I10 Essential (primary) hypertension: Secondary | ICD-10-CM | POA: Diagnosis not present

## 2023-10-15 DIAGNOSIS — F419 Anxiety disorder, unspecified: Secondary | ICD-10-CM | POA: Diagnosis not present

## 2023-10-20 DIAGNOSIS — G609 Hereditary and idiopathic neuropathy, unspecified: Secondary | ICD-10-CM | POA: Diagnosis not present

## 2023-11-03 DIAGNOSIS — Z23 Encounter for immunization: Secondary | ICD-10-CM | POA: Diagnosis not present

## 2024-02-24 DIAGNOSIS — Z85828 Personal history of other malignant neoplasm of skin: Secondary | ICD-10-CM | POA: Diagnosis not present

## 2024-02-24 DIAGNOSIS — E785 Hyperlipidemia, unspecified: Secondary | ICD-10-CM | POA: Diagnosis not present

## 2024-02-24 DIAGNOSIS — Z6825 Body mass index (BMI) 25.0-25.9, adult: Secondary | ICD-10-CM | POA: Diagnosis not present

## 2024-02-24 DIAGNOSIS — D509 Iron deficiency anemia, unspecified: Secondary | ICD-10-CM | POA: Diagnosis not present

## 2024-02-24 DIAGNOSIS — F419 Anxiety disorder, unspecified: Secondary | ICD-10-CM | POA: Diagnosis not present

## 2024-02-24 DIAGNOSIS — F331 Major depressive disorder, recurrent, moderate: Secondary | ICD-10-CM | POA: Diagnosis not present

## 2024-02-24 DIAGNOSIS — E538 Deficiency of other specified B group vitamins: Secondary | ICD-10-CM | POA: Diagnosis not present

## 2024-02-24 DIAGNOSIS — I739 Peripheral vascular disease, unspecified: Secondary | ICD-10-CM | POA: Diagnosis not present

## 2024-02-24 DIAGNOSIS — I10 Essential (primary) hypertension: Secondary | ICD-10-CM | POA: Diagnosis not present

## 2024-02-24 DIAGNOSIS — R7303 Prediabetes: Secondary | ICD-10-CM | POA: Diagnosis not present

## 2024-02-24 DIAGNOSIS — R634 Abnormal weight loss: Secondary | ICD-10-CM | POA: Diagnosis not present

## 2024-02-24 DIAGNOSIS — R5383 Other fatigue: Secondary | ICD-10-CM | POA: Diagnosis not present

## 2024-03-14 DIAGNOSIS — R634 Abnormal weight loss: Secondary | ICD-10-CM | POA: Diagnosis not present

## 2024-03-14 DIAGNOSIS — K760 Fatty (change of) liver, not elsewhere classified: Secondary | ICD-10-CM | POA: Diagnosis not present

## 2024-03-14 DIAGNOSIS — R162 Hepatomegaly with splenomegaly, not elsewhere classified: Secondary | ICD-10-CM | POA: Diagnosis not present

## 2024-03-14 DIAGNOSIS — K449 Diaphragmatic hernia without obstruction or gangrene: Secondary | ICD-10-CM | POA: Diagnosis not present

## 2024-04-04 DIAGNOSIS — Z1231 Encounter for screening mammogram for malignant neoplasm of breast: Secondary | ICD-10-CM | POA: Diagnosis not present

## 2024-07-27 DIAGNOSIS — F419 Anxiety disorder, unspecified: Secondary | ICD-10-CM | POA: Diagnosis not present

## 2024-07-27 DIAGNOSIS — M810 Age-related osteoporosis without current pathological fracture: Secondary | ICD-10-CM | POA: Diagnosis not present

## 2024-07-27 DIAGNOSIS — F331 Major depressive disorder, recurrent, moderate: Secondary | ICD-10-CM | POA: Diagnosis not present

## 2024-07-27 DIAGNOSIS — Z0000 Encounter for general adult medical examination without abnormal findings: Secondary | ICD-10-CM | POA: Diagnosis not present

## 2024-07-27 DIAGNOSIS — Z85828 Personal history of other malignant neoplasm of skin: Secondary | ICD-10-CM | POA: Diagnosis not present

## 2024-07-27 DIAGNOSIS — Z23 Encounter for immunization: Secondary | ICD-10-CM | POA: Diagnosis not present

## 2024-07-27 DIAGNOSIS — Z1339 Encounter for screening examination for other mental health and behavioral disorders: Secondary | ICD-10-CM | POA: Diagnosis not present

## 2024-07-27 DIAGNOSIS — E785 Hyperlipidemia, unspecified: Secondary | ICD-10-CM | POA: Diagnosis not present

## 2024-07-27 DIAGNOSIS — Z1331 Encounter for screening for depression: Secondary | ICD-10-CM | POA: Diagnosis not present

## 2024-07-27 DIAGNOSIS — I739 Peripheral vascular disease, unspecified: Secondary | ICD-10-CM | POA: Diagnosis not present

## 2024-07-27 DIAGNOSIS — K219 Gastro-esophageal reflux disease without esophagitis: Secondary | ICD-10-CM | POA: Diagnosis not present

## 2024-07-27 DIAGNOSIS — Z1389 Encounter for screening for other disorder: Secondary | ICD-10-CM | POA: Diagnosis not present

## 2024-07-27 DIAGNOSIS — I10 Essential (primary) hypertension: Secondary | ICD-10-CM | POA: Diagnosis not present

## 2024-07-27 DIAGNOSIS — G609 Hereditary and idiopathic neuropathy, unspecified: Secondary | ICD-10-CM | POA: Diagnosis not present

## 2024-07-27 DIAGNOSIS — E538 Deficiency of other specified B group vitamins: Secondary | ICD-10-CM | POA: Diagnosis not present

## 2024-07-27 DIAGNOSIS — R7303 Prediabetes: Secondary | ICD-10-CM | POA: Diagnosis not present

## 2024-07-27 DIAGNOSIS — E559 Vitamin D deficiency, unspecified: Secondary | ICD-10-CM | POA: Diagnosis not present
